# Patient Record
Sex: Male | Born: 1995 | Race: White | Hispanic: No | Marital: Single | Smoking: Never smoker
Health system: Southern US, Community
[De-identification: ages and names within clinical notes are randomized; demographics above are authoritative.]

## PROBLEM LIST (undated history)

## (undated) DIAGNOSIS — E039 Hypothyroidism, unspecified: Secondary | ICD-10-CM

## (undated) DIAGNOSIS — R6250 Unspecified lack of expected normal physiological development in childhood: Secondary | ICD-10-CM

## (undated) DIAGNOSIS — E109 Type 1 diabetes mellitus without complications: Secondary | ICD-10-CM

## (undated) DIAGNOSIS — R000 Tachycardia, unspecified: Secondary | ICD-10-CM

## (undated) DIAGNOSIS — E049 Nontoxic goiter, unspecified: Secondary | ICD-10-CM

## (undated) DIAGNOSIS — H539 Unspecified visual disturbance: Secondary | ICD-10-CM

## (undated) DIAGNOSIS — E063 Autoimmune thyroiditis: Secondary | ICD-10-CM

## (undated) DIAGNOSIS — E11618 Type 2 diabetes mellitus with other diabetic arthropathy: Secondary | ICD-10-CM

## (undated) DIAGNOSIS — B2790 Infectious mononucleosis, unspecified without complication: Secondary | ICD-10-CM

## (undated) DIAGNOSIS — E11649 Type 2 diabetes mellitus with hypoglycemia without coma: Secondary | ICD-10-CM

## (undated) DIAGNOSIS — F319 Bipolar disorder, unspecified: Secondary | ICD-10-CM

## (undated) DIAGNOSIS — I10 Essential (primary) hypertension: Secondary | ICD-10-CM

## (undated) DIAGNOSIS — F909 Attention-deficit hyperactivity disorder, unspecified type: Secondary | ICD-10-CM

## (undated) HISTORY — DX: Tachycardia, unspecified: R00.0

## (undated) HISTORY — DX: Autoimmune thyroiditis: E06.3

## (undated) HISTORY — DX: Type 1 diabetes mellitus without complications: E10.9

## (undated) HISTORY — DX: Type 2 diabetes mellitus with other diabetic arthropathy: E11.618

## (undated) HISTORY — DX: Unspecified lack of expected normal physiological development in childhood: R62.50

## (undated) HISTORY — PX: TONSILLECTOMY: SUR1361

## (undated) HISTORY — DX: Type 2 diabetes mellitus with hypoglycemia without coma: E11.649

## (undated) HISTORY — DX: Nontoxic goiter, unspecified: E04.9

---
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
## 2013-04-19 ENCOUNTER — Encounter (HOSPITAL_COMMUNITY): Payer: Self-pay | Admitting: Emergency Medicine

## 2013-04-19 ENCOUNTER — Emergency Department (HOSPITAL_COMMUNITY)
Admission: EM | Admit: 2013-04-19 | Discharge: 2013-04-20 | Disposition: A | Discharge status: Home / Self Care | Payer: Medicaid Other | Attending: Emergency Medicine | Admitting: Emergency Medicine

## 2013-04-19 DIAGNOSIS — I10 Essential (primary) hypertension: Secondary | ICD-10-CM | POA: Insufficient documentation

## 2013-04-19 DIAGNOSIS — Z8619 Personal history of other infectious and parasitic diseases: Secondary | ICD-10-CM | POA: Insufficient documentation

## 2013-04-19 DIAGNOSIS — E1049 Type 1 diabetes mellitus with other diabetic neurological complication: Secondary | ICD-10-CM | POA: Insufficient documentation

## 2013-04-19 DIAGNOSIS — B349 Viral infection, unspecified: Secondary | ICD-10-CM

## 2013-04-19 DIAGNOSIS — Z79899 Other long term (current) drug therapy: Secondary | ICD-10-CM | POA: Insufficient documentation

## 2013-04-19 DIAGNOSIS — G909 Disorder of the autonomic nervous system, unspecified: Secondary | ICD-10-CM | POA: Insufficient documentation

## 2013-04-19 DIAGNOSIS — B9789 Other viral agents as the cause of diseases classified elsewhere: Secondary | ICD-10-CM | POA: Insufficient documentation

## 2013-04-19 DIAGNOSIS — R000 Tachycardia, unspecified: Secondary | ICD-10-CM | POA: Insufficient documentation

## 2013-04-19 DIAGNOSIS — R509 Fever, unspecified: Secondary | ICD-10-CM | POA: Insufficient documentation

## 2013-04-19 DIAGNOSIS — R1110 Vomiting, unspecified: Secondary | ICD-10-CM | POA: Insufficient documentation

## 2013-04-19 DIAGNOSIS — E039 Hypothyroidism, unspecified: Secondary | ICD-10-CM | POA: Insufficient documentation

## 2013-04-19 DIAGNOSIS — Z8669 Personal history of other diseases of the nervous system and sense organs: Secondary | ICD-10-CM | POA: Insufficient documentation

## 2013-04-19 DIAGNOSIS — E063 Autoimmune thyroiditis: Secondary | ICD-10-CM | POA: Insufficient documentation

## 2013-04-19 DIAGNOSIS — Z794 Long term (current) use of insulin: Secondary | ICD-10-CM | POA: Insufficient documentation

## 2013-04-19 LAB — GLUCOSE, CAPILLARY: Glucose-Capillary: 180 mg/dL — ABNORMAL HIGH (ref 70–99)

## 2013-04-19 MED ORDER — ACETAMINOPHEN 325 MG PO TABS
650.0000 mg | ORAL_TABLET | Freq: Once | ORAL | Status: AC
  Administered 2013-04-19: 650 mg via ORAL
  Filled 2013-04-19: qty 2

## 2013-04-19 NOTE — ED Notes (Signed)
Pt. reports nasal congestion , coryza , generalized fatigue for 2 days with chills .

## 2013-04-19 NOTE — ED Notes (Signed)
cbg checked

## 2013-04-19 NOTE — ED Provider Notes (Signed)
CSN: [id number]     Arrival date & time 04/19/13  2218 History   First MD Initiated Contact with Patient 04/19/13 2235     Chief Complaint  Patient presents with  . Nasal Congestion   (Consider location/radiation/quality/duration/timing/severity/associated sxs/prior Treatment) HPI Comments: Patient presents emergency department with chief complaint of nasal congestion x2 days. He states that he has had associated subjective fever and chills. States that today he has had one episode of vomiting. He has not tried taking anything to alleviate his symptoms. His past medical history remarkable for type 1 diabetes. He is on insulin shots. He states that her sugars have been harder to control over the past couple of days. He endorses exposure to a sick coworker, who had similar symptoms. Denies sore throat. Denies productive cough.  The history is provided by the patient. No language interpreter was used.    Past Medical History  Diagnosis Date  . Hypertension   . Hypothyroidism   . Type 1 diabetes mellitus not at goal   . Goiter   . Physical growth delay   . Thyroiditis, autoimmune   . Hypertension   . Autonomic neuropathy due to diabetes   . Tachycardia   . Diabetic arthropathy   . Diabetes mellitus     Dec 2007  . Hypoglycemia associated with diabetes   . Vision abnormalities glasses  . Mononucleosis    Past Surgical History  Procedure Laterality Date  . Tonsillectomy  at young age   Family History  Problem Relation Age of Onset  . Stroke Mother   . COPD Father   . Diabetes Maternal Grandmother    History  Substance Use Topics  . Smoking status: Never Smoker   . Smokeless tobacco: Never Used  . Alcohol Use: No    Review of Systems  All other systems reviewed and are negative.    Allergies  Review of patient's allergies indicates no known allergies.  Home Medications   Current Outpatient Rx  Name  Route  Sig  Dispense  Refill  . amphetamine-dextroamphetamine  (ADDERALL XR) 15 MG 24 hr capsule   Oral   Take 15 mg by mouth every morning.         Marland Kitchen glucose blood (ACCU-CHEK SMARTVIEW) test strip      Check sugar 6 x daily   200 each   6     For use with Aviva Nano meter. For questions regar ...   . insulin aspart (NOVOLOG) 100 UNIT/ML injection   Subcutaneous   Inject 4-10 Units into the skin 3 (three) times daily before meals. Per sliding scale.         . insulin glargine (LANTUS) 100 UNIT/ML injection   Subcutaneous   Inject 33 Units into the skin at bedtime.         Marland Kitchen levothyroxine (SYNTHROID, LEVOTHROID) 25 MCG tablet   Oral   Take 1 tablet (25 mcg total) by mouth daily.   30 tablet   5   . lisinopril (PRINIVIL,ZESTRIL) 10 MG tablet   Oral   Take 10 mg by mouth daily.          BP 131/65  Pulse 113  Temp(Src) 98.1 F (36.7 C) (Oral)  Resp 16  Ht 5\' 11"  (1.803 m)  Wt 175 lb (79.379 kg)  BMI 24.42 kg/m2  SpO2 98% Physical Exam  Nursing note and vitals reviewed. Constitutional: He is oriented to person, place, and time. He appears well-developed and well-nourished.  HENT:  Head:  Normocephalic and atraumatic.  Right Ear: External ear normal.  Left Ear: External ear normal.  Nose: Nose normal.  Mouth/Throat: Oropharynx is clear and moist. No oropharyngeal exudate.  Eyes: Conjunctivae and EOM are normal. Pupils are equal, round, and reactive to light. Right eye exhibits no discharge. Left eye exhibits no discharge. No scleral icterus.  Neck: Normal range of motion. Neck supple. No JVD present.  Cardiovascular: Regular rhythm, normal heart sounds and intact distal pulses.  Exam reveals no gallop and no friction rub.   No murmur heard. Tachycardic  Pulmonary/Chest: Effort normal and breath sounds normal. No respiratory distress. He has no wheezes. He has no rales. He exhibits no tenderness.  Abdominal: Soft. Bowel sounds are normal. He exhibits no distension and no mass. There is no tenderness. There is no rebound and  no guarding.  Musculoskeletal: Normal range of motion. He exhibits no edema and no tenderness.  Neurological: He is alert and oriented to person, place, and time. He has normal reflexes.  CN 3-12 intact  Skin: Skin is warm and dry.  Psychiatric: He has a normal mood and affect. His behavior is normal. Judgment and thought content normal.    ED Course  Procedures (including critical care time) Labs Review Labs Reviewed - No data to display Imaging Review No results found.  MDM   1. Viral illness    Patients symptoms are consistent with URI, likely viral etiology. Discussed that antibiotics are not indicated for viral infections. Pt will be discharged with symptomatic treatment.  Verbalizes understanding and is agreeable with plan. Pt is hemodynamically stable & in NAD prior to dc.  Urged close monitoring of sugar.  Filed Vitals:   04/20/13 0006  BP: 112/60  Pulse: 96  Temp: 97 F (36.1 C)  Resp: [address], PA-C 04/20/13 0013

## 2013-04-20 MED ORDER — GUAIFENESIN 100 MG/5ML PO LIQD: 100.0000 mg | ORAL | Status: DC | PRN

## 2013-04-20 MED ORDER — CETIRIZINE HCL 10 MG PO TABS: 10.0000 mg | ORAL_TABLET | Freq: Every day | ORAL | Status: DC

## 2013-04-20 NOTE — ED Provider Notes (Signed)
Medical screening examination/treatment/procedure(s) were performed by non-physician practitioner and as supervising physician I was immediately available for consultation/collaboration.  Layla Maw Ward, DO 04/20/13 [phone]

## 2013-04-24 ENCOUNTER — Other Ambulatory Visit: Payer: Self-pay | Admitting: "Endocrinology

## 2013-04-24 DIAGNOSIS — E1065 Type 1 diabetes mellitus with hyperglycemia: Secondary | ICD-10-CM

## 2013-04-24 DIAGNOSIS — E038 Other specified hypothyroidism: Secondary | ICD-10-CM

## 2013-05-04 ENCOUNTER — Ambulatory Visit: Payer: Medicaid Other | Admitting: "Endocrinology

## 2013-05-04 IMAGING — CR DG CHEST 2V
2 series · 2 of 2 positions shown · non-contrast
Comparison: Chest radiograph 06/28/2011

CLINICAL DATA: Pneumothorax.

CHEST - 2 VIEW

[w chest pa]
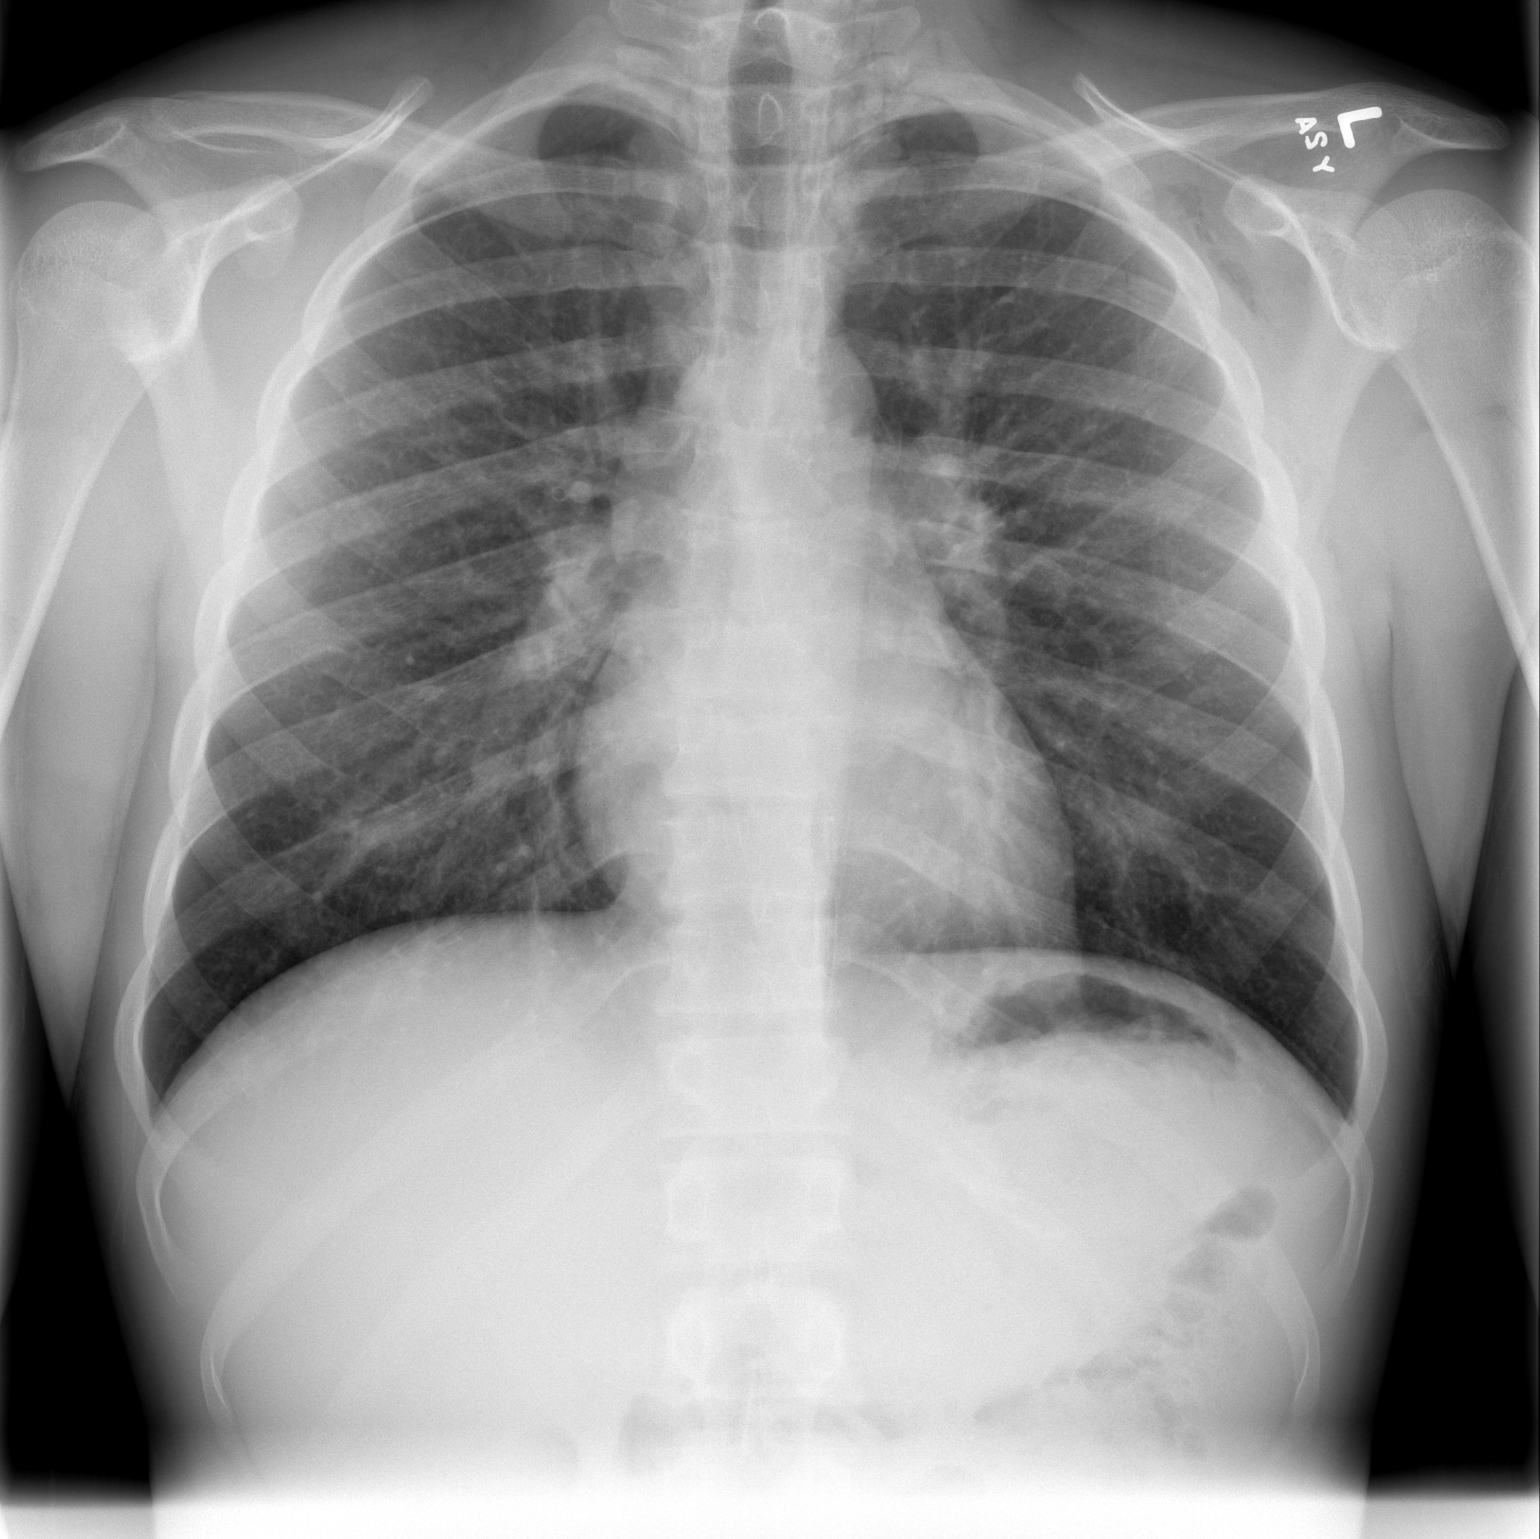

[w chest lat]
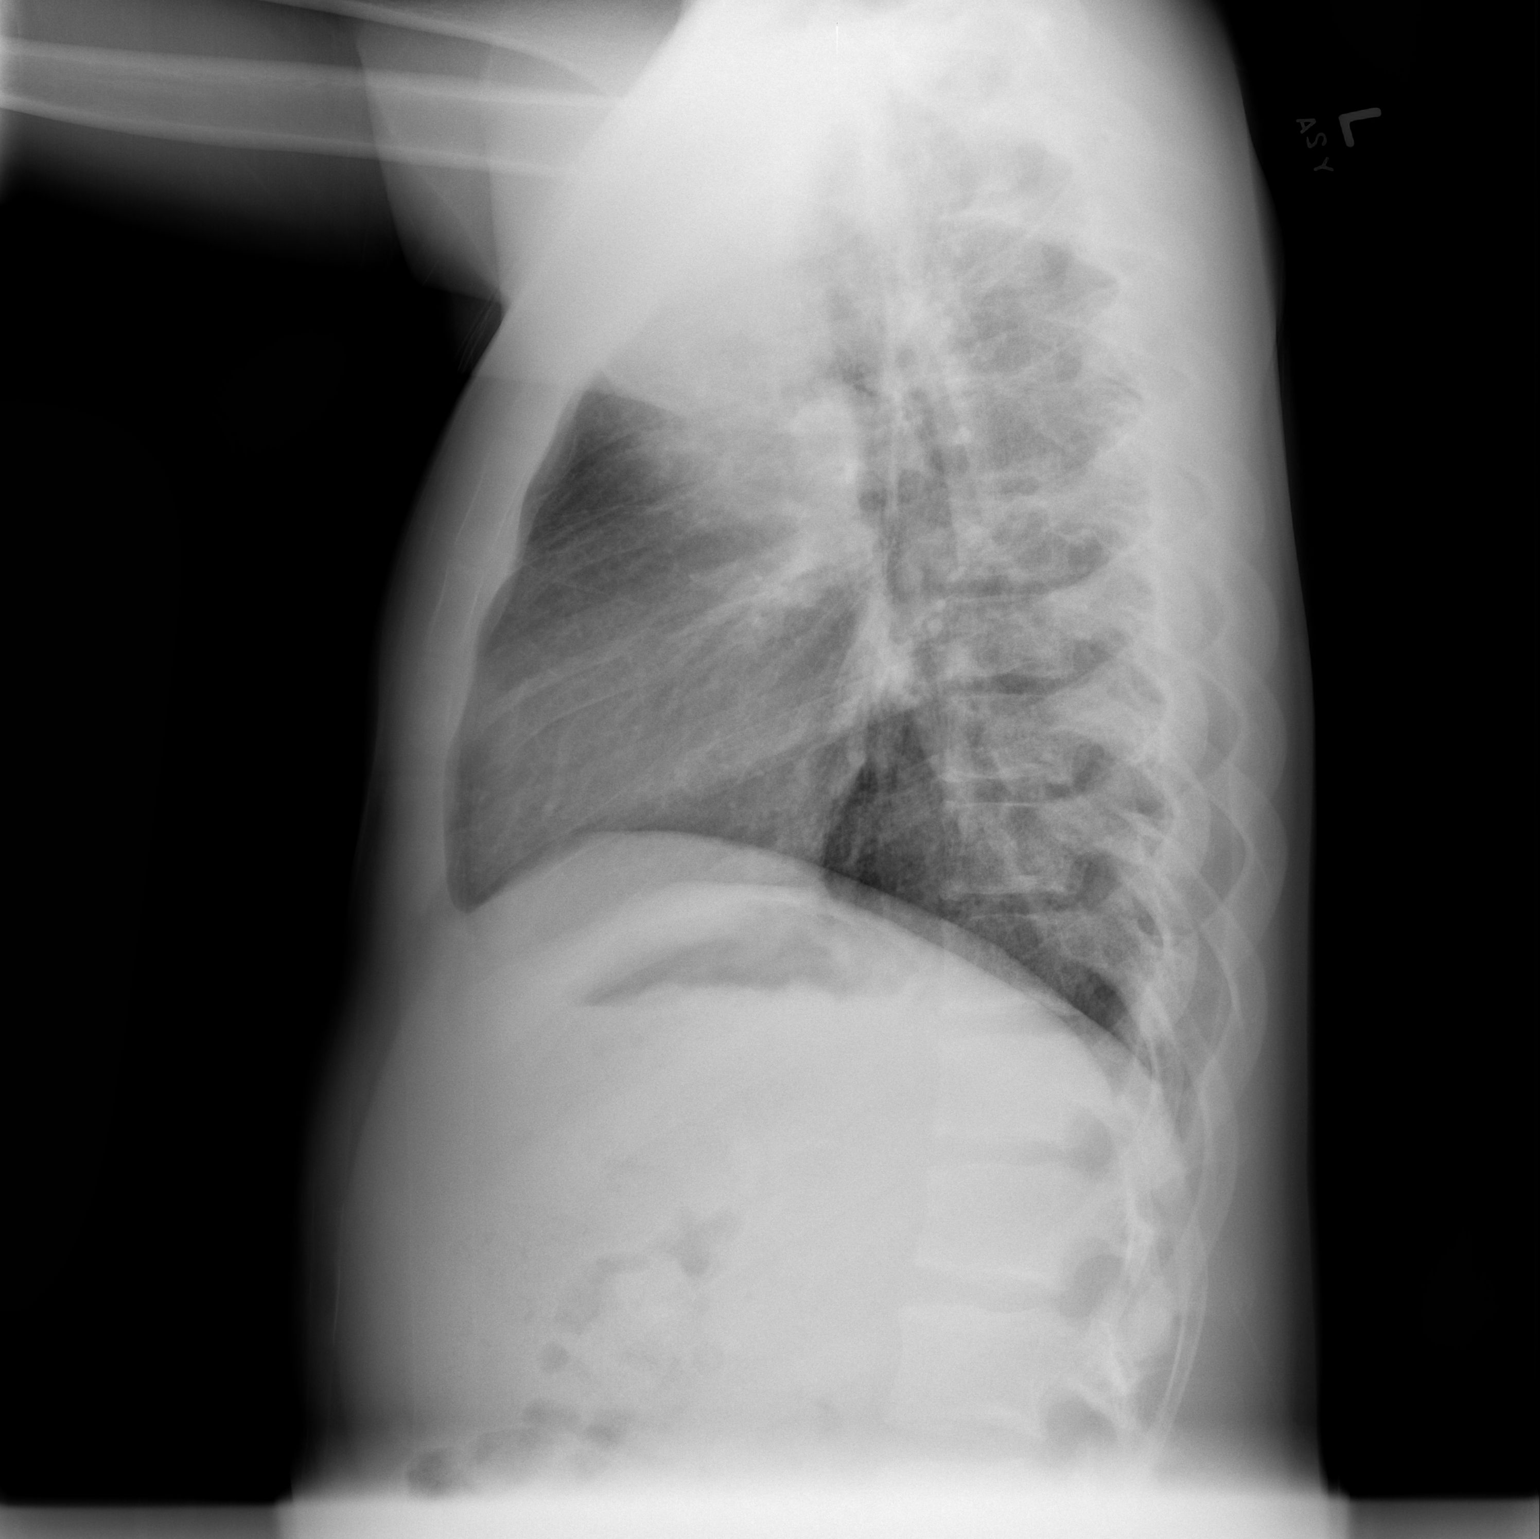

[2 of 2 positions shown; findings below may reference images not displayed]

FINDINGS: Normal heart, mediastinal, and hilar contours.  Normal
pulmonary vascularity.  The trachea is midline.  The left
pneumothorax is smaller on today's examination, as compared to
06/28/2011.  A very tiny amount of left pleural air at the apex is
visualized.  Subcutaneous emphysema in the infraclavicular region,
left axilla, and left neck is similar to slightly decreased
compared to recent prior.  No airspace disease or pleural effusion.
No acute bony abnormality.
IMPRESSION: 1.  Tiny left apical pneumothorax. Pneumothorax is decreased in
size compared to 06/28/2011.

2.  Similar to slight decrease in subcutaneous gas left chest and
neck.

## 2013-06-05 ENCOUNTER — Other Ambulatory Visit: Payer: Self-pay | Admitting: "Endocrinology

## 2013-06-05 DIAGNOSIS — E1065 Type 1 diabetes mellitus with hyperglycemia: Secondary | ICD-10-CM

## 2013-06-22 ENCOUNTER — Other Ambulatory Visit: Payer: Self-pay | Admitting: *Deleted

## 2013-06-22 DIAGNOSIS — E1065 Type 1 diabetes mellitus with hyperglycemia: Secondary | ICD-10-CM

## 2013-07-11 ENCOUNTER — Encounter: Payer: Self-pay | Admitting: "Endocrinology

## 2013-07-11 ENCOUNTER — Ambulatory Visit (INDEPENDENT_AMBULATORY_CARE_PROVIDER_SITE_OTHER): Payer: Medicaid Other | Admitting: "Endocrinology

## 2013-07-11 VITALS — BP 135/86 | HR 83 | Wt 168.0 lb

## 2013-07-11 DIAGNOSIS — E063 Autoimmune thyroiditis: Secondary | ICD-10-CM

## 2013-07-11 DIAGNOSIS — E1149 Type 2 diabetes mellitus with other diabetic neurological complication: Secondary | ICD-10-CM

## 2013-07-11 DIAGNOSIS — E1169 Type 2 diabetes mellitus with other specified complication: Secondary | ICD-10-CM

## 2013-07-11 DIAGNOSIS — E038 Other specified hypothyroidism: Secondary | ICD-10-CM

## 2013-07-11 DIAGNOSIS — E049 Nontoxic goiter, unspecified: Secondary | ICD-10-CM

## 2013-07-11 DIAGNOSIS — Z9119 Patient's noncompliance with other medical treatment and regimen: Secondary | ICD-10-CM | POA: Insufficient documentation

## 2013-07-11 DIAGNOSIS — E1143 Type 2 diabetes mellitus with diabetic autonomic (poly)neuropathy: Secondary | ICD-10-CM

## 2013-07-11 DIAGNOSIS — G909 Disorder of the autonomic nervous system, unspecified: Secondary | ICD-10-CM

## 2013-07-11 DIAGNOSIS — E1065 Type 1 diabetes mellitus with hyperglycemia: Secondary | ICD-10-CM

## 2013-07-11 DIAGNOSIS — Z91199 Patient's noncompliance with other medical treatment and regimen due to unspecified reason: Secondary | ICD-10-CM | POA: Insufficient documentation

## 2013-07-11 DIAGNOSIS — E11649 Type 2 diabetes mellitus with hypoglycemia without coma: Secondary | ICD-10-CM

## 2013-07-11 MED ORDER — GLUCOSE BLOOD VI STRP: ORAL_STRIP | Status: DC

## 2013-07-11 NOTE — Patient Instructions (Signed)
Follow up visit in 3 months. Please call Dr. Fransico Sirena Riddle on December 3rd between 8:00-9:30 PM.
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
## 2013-07-15 LAB — COMPREHENSIVE METABOLIC PANEL
Albumin: 4.2 g/dL (ref 3.5–5.2)
Alkaline Phosphatase: 116 U/L (ref 39–117)
BUN: 21 mg/dL (ref 6–23)
CO2: 32 mEq/L (ref 19–32)
Calcium: 9.7 mg/dL (ref 8.4–10.5)
Glucose, Bld: 127 mg/dL — ABNORMAL HIGH (ref 70–99)
Potassium: 4.5 mEq/L (ref 3.5–5.3)

## 2013-07-15 LAB — T3, FREE: T3, Free: 3.4 pg/mL (ref 2.3–4.2)

## 2013-07-15 LAB — MICROALBUMIN / CREATININE URINE RATIO
Creatinine, Urine: 265.3 mg/dL
Microalb Creat Ratio: 28.8 mg/g (ref 0.0–30.0)
Microalb, Ur: 7.64 mg/dL — ABNORMAL HIGH (ref 0.00–1.89)

## 2013-07-15 LAB — TSH: TSH: 4.329 u[IU]/mL (ref 0.350–4.500)

## 2013-07-15 LAB — LIPID PANEL: HDL: 66 mg/dL (ref >34)

## 2013-07-15 LAB — T4, FREE: Free T4: 1.17 ng/dL (ref 0.80–1.80)

## 2013-07-19 ENCOUNTER — Telehealth: Payer: Self-pay | Admitting: "Endocrinology

## 2013-07-19 NOTE — Telephone Encounter (Signed)
When I saw the patient in clinic on 07/11/13 I asked him to call tonight to discuss BGs. He called tonight from work, but had left his  BG meter at home. I asked him to call me tomorrow at the office when he has his BG meter in hand so we can discuss his BGs David Stall

## 2013-08-09 ENCOUNTER — Telehealth: Payer: Self-pay | Admitting: Pediatric Endocrinology

## 2013-08-09 NOTE — Telephone Encounter (Signed)
Late documentation for call 12/9  Lantus =33 units Novolog 150/30/10  12/7 82 - 209 -  158 12/8 225 244  498 12/9 68 [phone]  Novolog +1 at breakfast. Call Sunday  Zanyla Klebba REBECCA

## 2013-08-14 ENCOUNTER — Other Ambulatory Visit: Payer: Self-pay | Admitting: *Deleted

## 2013-08-14 DIAGNOSIS — E038 Other specified hypothyroidism: Secondary | ICD-10-CM

## 2013-08-14 MED ORDER — LEVOTHYROXINE SODIUM 75 MCG PO TABS: 75.0000 ug | ORAL_TABLET | Freq: Every day | ORAL | Status: DC

## 2013-08-24 ENCOUNTER — Other Ambulatory Visit: Payer: Self-pay | Admitting: "Endocrinology

## 2013-10-11 ENCOUNTER — Ambulatory Visit: Payer: Medicaid Other | Admitting: "Endocrinology

## 2013-10-17 ENCOUNTER — Emergency Department (HOSPITAL_COMMUNITY)
Admission: EM | Admit: 2013-10-17 | Discharge: 2013-10-18 | Disposition: A | Discharge status: Home / Self Care | Payer: Medicaid Other | Attending: Emergency Medicine | Admitting: Emergency Medicine

## 2013-10-17 ENCOUNTER — Encounter (HOSPITAL_COMMUNITY): Payer: Self-pay | Admitting: Emergency Medicine

## 2013-10-17 ENCOUNTER — Emergency Department (HOSPITAL_COMMUNITY): Payer: Medicaid Other

## 2013-10-17 DIAGNOSIS — R000 Tachycardia, unspecified: Secondary | ICD-10-CM | POA: Insufficient documentation

## 2013-10-17 DIAGNOSIS — R6250 Unspecified lack of expected normal physiological development in childhood: Secondary | ICD-10-CM | POA: Insufficient documentation

## 2013-10-17 DIAGNOSIS — J029 Acute pharyngitis, unspecified: Secondary | ICD-10-CM | POA: Insufficient documentation

## 2013-10-17 DIAGNOSIS — E1049 Type 1 diabetes mellitus with other diabetic neurological complication: Secondary | ICD-10-CM | POA: Insufficient documentation

## 2013-10-17 DIAGNOSIS — I10 Essential (primary) hypertension: Secondary | ICD-10-CM | POA: Insufficient documentation

## 2013-10-17 DIAGNOSIS — R05 Cough: Secondary | ICD-10-CM | POA: Insufficient documentation

## 2013-10-17 DIAGNOSIS — H539 Unspecified visual disturbance: Secondary | ICD-10-CM | POA: Insufficient documentation

## 2013-10-17 DIAGNOSIS — E1069 Type 1 diabetes mellitus with other specified complication: Secondary | ICD-10-CM | POA: Insufficient documentation

## 2013-10-17 DIAGNOSIS — X58XXXA Exposure to other specified factors, initial encounter: Secondary | ICD-10-CM | POA: Insufficient documentation

## 2013-10-17 DIAGNOSIS — E049 Nontoxic goiter, unspecified: Secondary | ICD-10-CM | POA: Insufficient documentation

## 2013-10-17 DIAGNOSIS — E063 Autoimmune thyroiditis: Secondary | ICD-10-CM | POA: Insufficient documentation

## 2013-10-17 DIAGNOSIS — G909 Disorder of the autonomic nervous system, unspecified: Secondary | ICD-10-CM | POA: Insufficient documentation

## 2013-10-17 DIAGNOSIS — K0889 Other specified disorders of teeth and supporting structures: Secondary | ICD-10-CM

## 2013-10-17 DIAGNOSIS — Y939 Activity, unspecified: Secondary | ICD-10-CM | POA: Insufficient documentation

## 2013-10-17 DIAGNOSIS — S025XXA Fracture of tooth (traumatic), initial encounter for closed fracture: Secondary | ICD-10-CM | POA: Insufficient documentation

## 2013-10-17 DIAGNOSIS — R059 Cough, unspecified: Secondary | ICD-10-CM | POA: Insufficient documentation

## 2013-10-17 DIAGNOSIS — Y929 Unspecified place or not applicable: Secondary | ICD-10-CM | POA: Insufficient documentation

## 2013-10-17 DIAGNOSIS — K089 Disorder of teeth and supporting structures, unspecified: Secondary | ICD-10-CM | POA: Insufficient documentation

## 2013-10-17 DIAGNOSIS — Z794 Long term (current) use of insulin: Secondary | ICD-10-CM | POA: Insufficient documentation

## 2013-10-17 DIAGNOSIS — B2790 Infectious mononucleosis, unspecified without complication: Secondary | ICD-10-CM | POA: Insufficient documentation

## 2013-10-17 DIAGNOSIS — Z79899 Other long term (current) drug therapy: Secondary | ICD-10-CM | POA: Insufficient documentation

## 2013-10-17 LAB — CBG MONITORING, ED: GLUCOSE-CAPILLARY: 381 mg/dL — AB (ref 70–99)

## 2013-10-17 MED ORDER — IBUPROFEN 400 MG PO TABS
800.0000 mg | ORAL_TABLET | Freq: Once | ORAL | Status: AC
  Administered 2013-10-17: 800 mg via ORAL
  Filled 2013-10-17: qty 2

## 2013-10-17 MED ORDER — SODIUM CHLORIDE 0.9 % IV BOLUS (SEPSIS)
1000.0000 mL | Freq: Once | INTRAVENOUS | Status: AC
  Administered 2013-10-17: 1000 mL via INTRAVENOUS

## 2013-10-17 NOTE — ED Provider Notes (Signed)
CSN: [id number]     Arrival date & time 10/17/13  2130 History  This chart was scribed for non-physician practitioner Lowella DellGray Aryaa Bunting, PA-C working with Layla MawKristen N Ward, DO by Danella Maiersaroline Early, ED Scribe. This patient was seen in room TR05C/TR05C and the patient's care was started at 10:02 PM.    Chief Complaint  Patient presents with  . Dental Pain   The history is provided by the patient. No language interpreter was used.   HPI Comments: [name] Noecker is a 18 y.o. male with a h/o DM1 who presents to the Emergency Department complaining of constant, worsening, throbbing, left lower dental pain onset 2 days ago when a tooth that first cracked 1 year ago cracked further and fell out. He rates the severity of the pain as a 9/10. He took 400mg  ibuprofen this morning with some relief. He reports nausea today but no vomiting. He denies fevers or chills, headache, SOB, leg swelling. He denies recent trauma or injury. He denies recent change in medications. He denies sick contacts but states he lives on campus at [employer]. He states his sugars have been running high the last few weeks since he moved to [place] but are improving. He does not smoke.    Past Medical History  Diagnosis Date  . Hypertension   . Hypothyroidism   . Type 1 diabetes mellitus not at goal   . Goiter   . Physical growth delay   . Thyroiditis, autoimmune   . Hypertension   . Autonomic neuropathy due to diabetes   . Tachycardia   . Diabetic arthropathy   . Diabetes mellitus     Dec 2007  . Hypoglycemia associated with diabetes   . Vision abnormalities glasses  . Mononucleosis    Past Surgical History  Procedure Laterality Date  . Tonsillectomy  at young age   Family History  Problem Relation Age of Onset  . Stroke Mother   . COPD Father   . Diabetes Maternal Grandmother    History  Substance Use Topics  . Smoking status: Never Smoker   . Smokeless tobacco: Never Used  . Alcohol Use: No    Review of Systems  HENT:  Positive for sore throat. Negative for trouble swallowing.   Respiratory: Positive for cough (productive with clear phlegm since this morning).   All other systems reviewed and are negative.      Allergies  Review of patient's allergies indicates no known allergies.  Home Medications   Current Outpatient Rx  Name  Route  Sig  Dispense  Refill  . amphetamine-dextroamphetamine (ADDERALL XR) 15 MG 24 hr capsule   Oral   Take 15 mg by mouth every morning.         . insulin aspart (NOVOLOG) 100 UNIT/ML injection   Subcutaneous   Inject 4-10 Units into the skin 3 (three) times daily before meals. Per sliding scale.         Marland Kitchen. LANTUS SOLOSTAR 100 UNIT/ML SOPN      INJECT 33 UNITS AT BEDTIME AS DIRECTED   5 pen   4     NEEDS REFILLS   . levothyroxine (SYNTHROID, LEVOTHROID) 75 MCG tablet   Oral   Take 1 tablet (75 mcg total) by mouth daily. Take 1/2 tablet daily for a total of 37.5 mcg daily.   15 tablet   6   . lisinopril (PRINIVIL,ZESTRIL) 10 MG tablet   Oral   Take 10 mg by mouth daily.         .Marland Kitchen  NOVOLOG FLEXPEN 100 UNIT/ML SOPN FlexPen      INJECT UP TO 50 UNITS DAILY AS DIRECTED   5 pen   4     NEEDS REFILLS   . ibuprofen (ADVIL,MOTRIN) 800 MG tablet   Oral   Take 1 tablet (800 mg total) by mouth 3 (three) times daily.   21 tablet   0   . penicillin v potassium (VEETID) 500 MG tablet   Oral   Take 1 tablet (500 mg total) by mouth 4 (four) times daily.   28 tablet   0    BP 133/94  Pulse 118  Temp(Src) 99.2 F (37.3 C) (Oral)  Resp 18  Ht 5\' 11"  (1.803 m)  Wt 180 lb (81.647 kg)  BMI 25.12 kg/m2  SpO2 97% Physical Exam  Nursing note and vitals reviewed. Constitutional: He is oriented to person, place, and time. He appears well-developed and well-nourished. No distress.  HENT:  Head: Normocephalic and atraumatic.  Mouth/Throat:    Pulp exposed.  Eyes: Conjunctivae are normal.  Neck: No tracheal deviation present.  Cardiovascular:  Tachycardia present.   Pulmonary/Chest: Effort normal. No respiratory distress.  Musculoskeletal: Normal range of motion. He exhibits no edema.  Neurological: He is alert and oriented to person, place, and time.  Skin: Skin is warm and dry. He is not diaphoretic.  Psychiatric: He has a normal mood and affect. His behavior is normal.    ED Course  Procedures (including critical care time) Medications  ibuprofen (ADVIL,MOTRIN) tablet 800 mg (800 mg Oral Given 10/17/13 2322)  sodium chloride 0.9 % bolus 1,000 mL (0 mLs Intravenous Stopped 10/18/13 0050)    DIAGNOSTIC STUDIES: Oxygen Saturation is 97% on RA, normal by my interpretation.    COORDINATION OF CARE: 10:46 PM- Discussed treatment plan with pt which includes CXR, antibiotics, pain meds, and dentist referral. Pt agrees to plan.    Labs Review Labs Reviewed  CBG MONITORING, ED - Abnormal; Notable for the following:    Glucose-Capillary 381 (*)    All other components within normal limits  CBG MONITORING, ED - Abnormal; Notable for the following:    Glucose-Capillary 334 (*)    All other components within normal limits   Imaging Review No results found.   EKG Interpretation None      MDM   Final diagnoses:  Pain, dental  Broken tooth   Patient tachycardic on presentation. Improved with IV fluids.  Glucose 381, improved to 334 with IV fluids. Patient states he has not yet taken his nightly insulin. Patient asymptomatic, doubt DKA.   CXR shows no evidence of Pneumonia and is negative for PTX. Minimal peribronchial thickening noted.  Plan to treat patient's pain and start on antibiotics with close Dental follow up within 24 hours. Patient agrees with plan. Discharged in good condition.   Meds given in ED:  Medications  ibuprofen (ADVIL,MOTRIN) tablet 800 mg (800 mg Oral Given 10/17/13 2322)  sodium chloride 0.9 % bolus 1,000 mL (0 mLs Intravenous Stopped 10/18/13 0050)    Discharge Medication List as of 10/18/2013   1:12 AM    START taking these medications   Details  ibuprofen (ADVIL,MOTRIN) 800 MG tablet Take 1 tablet (800 mg total) by mouth 3 (three) times daily., Starting 10/18/2013, Until Discontinued, Print    penicillin v potassium (VEETID) 500 MG tablet Take 1 tablet (500 mg total) by mouth 4 (four) times daily., Starting 10/18/2013, Last dose on Wed 10/25/13, Print  I personally performed the services described in this documentation, which was scribed in my presence. The recorded information has been reviewed and is accurate.   Rudene Anda, PA-C 10/20/13 1301

## 2013-10-17 NOTE — ED Notes (Signed)
Bottom left side of mouth has a broken tooth.

## 2013-10-17 NOTE — ED Notes (Signed)
CBG 381 

## 2013-10-18 ENCOUNTER — Emergency Department (HOSPITAL_COMMUNITY): Payer: Medicaid Other

## 2013-10-18 LAB — CBG MONITORING, ED: GLUCOSE-CAPILLARY: 334 mg/dL — AB (ref 70–99)

## 2013-10-18 MED ORDER — PENICILLIN V POTASSIUM 500 MG PO TABS: 500.0000 mg | ORAL_TABLET | Freq: Four times a day (QID) | ORAL | Status: AC

## 2013-10-18 MED ORDER — IBUPROFEN 800 MG PO TABS: 800.0000 mg | ORAL_TABLET | Freq: Three times a day (TID) | ORAL | Status: DC

## 2013-10-18 NOTE — Discharge Instructions (Signed)

## 2013-10-20 NOTE — ED Provider Notes (Signed)
Medical screening examination/treatment/procedure(s) were performed by non-physician practitioner and as supervising physician I was immediately available for consultation/collaboration.   EKG Interpretation None        Kenzey Birkland N Varnell Orvis, DO 10/20/13 1503 

## 2013-11-05 ENCOUNTER — Emergency Department (HOSPITAL_COMMUNITY)
Admission: EM | Admit: 2013-11-05 | Discharge: 2013-11-05 | Disposition: A | Discharge status: Home / Self Care | Payer: Medicaid Other | Attending: Emergency Medicine | Admitting: Emergency Medicine

## 2013-11-05 ENCOUNTER — Encounter (HOSPITAL_COMMUNITY): Payer: Self-pay | Admitting: Emergency Medicine

## 2013-11-05 DIAGNOSIS — G909 Disorder of the autonomic nervous system, unspecified: Secondary | ICD-10-CM | POA: Insufficient documentation

## 2013-11-05 DIAGNOSIS — R739 Hyperglycemia, unspecified: Secondary | ICD-10-CM

## 2013-11-05 DIAGNOSIS — E1049 Type 1 diabetes mellitus with other diabetic neurological complication: Secondary | ICD-10-CM | POA: Insufficient documentation

## 2013-11-05 DIAGNOSIS — Z794 Long term (current) use of insulin: Secondary | ICD-10-CM | POA: Insufficient documentation

## 2013-11-05 DIAGNOSIS — I10 Essential (primary) hypertension: Secondary | ICD-10-CM

## 2013-11-05 DIAGNOSIS — Z8619 Personal history of other infectious and parasitic diseases: Secondary | ICD-10-CM | POA: Insufficient documentation

## 2013-11-05 DIAGNOSIS — K029 Dental caries, unspecified: Secondary | ICD-10-CM | POA: Insufficient documentation

## 2013-11-05 DIAGNOSIS — R05 Cough: Secondary | ICD-10-CM | POA: Insufficient documentation

## 2013-11-05 DIAGNOSIS — E039 Hypothyroidism, unspecified: Secondary | ICD-10-CM | POA: Insufficient documentation

## 2013-11-05 DIAGNOSIS — R059 Cough, unspecified: Secondary | ICD-10-CM

## 2013-11-05 DIAGNOSIS — Z79899 Other long term (current) drug therapy: Secondary | ICD-10-CM | POA: Insufficient documentation

## 2013-11-05 DIAGNOSIS — K047 Periapical abscess without sinus: Secondary | ICD-10-CM

## 2013-11-05 DIAGNOSIS — E063 Autoimmune thyroiditis: Secondary | ICD-10-CM | POA: Insufficient documentation

## 2013-11-05 LAB — CBG MONITORING, ED
GLUCOSE-CAPILLARY: 453 mg/dL — AB (ref 70–99)
GLUCOSE-CAPILLARY: 280 mg/dL — AB (ref 70–99)

## 2013-11-05 LAB — URINALYSIS, ROUTINE W REFLEX MICROSCOPIC
Bilirubin Urine: NEGATIVE
Hgb urine dipstick: NEGATIVE
Ketones, ur: 15 mg/dL — AB
LEUKOCYTES UA: NEGATIVE
NITRITE: NEGATIVE
PROTEIN: NEGATIVE mg/dL
Specific Gravity, Urine: 1.037 — ABNORMAL HIGH (ref 1.005–1.030)
Urobilinogen, UA: 0.2 mg/dL (ref 0.0–1.0)
pH: 6 (ref 5.0–8.0)

## 2013-11-05 LAB — BASIC METABOLIC PANEL
BUN: 14 mg/dL (ref 6–23)
CALCIUM: 10 mg/dL (ref 8.4–10.5)
CO2: 25 meq/L (ref 19–32)
CREATININE: 0.67 mg/dL (ref 0.50–1.35)
Chloride: 90 mEq/L — ABNORMAL LOW (ref 96–112)
GFR calc Af Amer: >90 mL/min (ref >90)
GFR calc non Af Amer: >90 mL/min (ref >90)
Glucose, Bld: 498 mg/dL — ABNORMAL HIGH (ref 70–99)
Potassium: 4.3 mEq/L (ref 3.7–5.3)
SODIUM: 132 meq/L — AB (ref 137–147)

## 2013-11-05 LAB — CBC WITH DIFFERENTIAL/PLATELET
BASOS ABS: 0 10*3/uL (ref 0.0–0.1)
BASOS PCT: 0 % (ref 0–1)
Eosinophils Absolute: 0.1 10*3/uL (ref 0.0–0.7)
Eosinophils Relative: 1 % (ref 0–5)
HEMATOCRIT: 44 % (ref 39.0–52.0)
Hemoglobin: 16.2 g/dL (ref 13.0–17.0)
LYMPHS PCT: 15 % (ref 12–46)
Lymphs Abs: 1.4 10*3/uL (ref 0.7–4.0)
MCH: 30.1 pg (ref 26.0–34.0)
MCHC: 36.8 g/dL — ABNORMAL HIGH (ref 30.0–36.0)
MCV: 81.6 fL (ref 78.0–100.0)
Monocytes Absolute: 0.8 10*3/uL (ref 0.1–1.0)
Monocytes Relative: 8 % (ref 3–12)
NEUTROS ABS: 7.4 10*3/uL (ref 1.7–7.7)
NEUTROS PCT: 76 % (ref 43–77)
PLATELETS: 266 10*3/uL (ref 150–400)
RBC: 5.39 MIL/uL (ref 4.22–5.81)
RDW: 12.3 % (ref 11.5–15.5)
WBC: 9.6 10*3/uL (ref 4.0–10.5)

## 2013-11-05 LAB — URINE MICROSCOPIC-ADD ON

## 2013-11-05 MED ORDER — ALBUTEROL SULFATE HFA 108 (90 BASE) MCG/ACT IN AERS
2.0000 | INHALATION_SPRAY | Freq: Once | RESPIRATORY_TRACT | Status: AC
  Administered 2013-11-05: 2 via RESPIRATORY_TRACT
  Filled 2013-11-05: qty 6.7

## 2013-11-05 MED ORDER — CLINDAMYCIN HCL 150 MG PO CAPS: 300.0000 mg | ORAL_CAPSULE | Freq: Three times a day (TID) | ORAL | Status: DC

## 2013-11-05 MED ORDER — HYDROCODONE-ACETAMINOPHEN 5-325 MG PO TABS: 1.0000 | ORAL_TABLET | ORAL | Status: DC | PRN

## 2013-11-05 MED ORDER — IBUPROFEN 400 MG PO TABS
800.0000 mg | ORAL_TABLET | Freq: Once | ORAL | Status: AC
  Administered 2013-11-05: 800 mg via ORAL
  Filled 2013-11-05: qty 2

## 2013-11-05 MED ORDER — SODIUM CHLORIDE 0.9 % IV BOLUS (SEPSIS)
1000.0000 mL | Freq: Once | INTRAVENOUS | Status: AC
  Administered 2013-11-05: 1000 mL via INTRAVENOUS

## 2013-11-05 NOTE — ED Notes (Signed)
Pt c/o right upper toothache onset today. Pt reports seen here couple week ago for left sided toothache. Pt woke up today with swelling to right upper tooth area. Pt was eating something hard and it caused drainage from right upper tooth to occur.

## 2013-11-05 NOTE — ED Notes (Signed)
Approx 700 ml infused.  Pt alert.

## 2013-11-05 NOTE — Discharge Instructions (Signed)
Continue to take Ibuprofen for pain Take Vicodin for severe pain - Please be careful with this medication.  It can cause drowsiness.  Use caution while driving, operating machinery, drinking alcohol, or any other activities that may impair your physical or mental abilities.   Take 2 puffs every 4-6 hours as needed for cough - follow-up with your doctor for continued management of your cough  Continue to take your lisinopril for your blood pressure - follow-up with your doctor regarding this  Continue to take your lantus and novolog for hyperglycemia - follow-up with your doctor next week for continued care  Follow-up at your dentist appointment this week  Return to the emergency department if you develop any changing/worsening condition, repeated vomiting, fever, difficulty swallowing/breathing, increased facial swelling, or any other concerns (please read additional information regarding your condition below)     Dental Abscess A dental abscess is a collection of infected fluid (pus) from a bacterial infection in the inner part of the tooth (pulp). It usually occurs at the end of the tooth's root.  CAUSES   Severe tooth decay.  Trauma to the tooth that allows bacteria to enter into the pulp, such as a broken or chipped tooth. SYMPTOMS   Severe pain in and around the infected tooth.  Swelling and redness around the abscessed tooth or in the mouth or face.  Tenderness.  Pus drainage.  Bad breath.  Bitter taste in the mouth.  Difficulty swallowing.  Difficulty opening the mouth.  Nausea.  Vomiting.  Chills.  Swollen neck glands. DIAGNOSIS   A medical and dental history will be taken.  An examination will be performed by tapping on the abscessed tooth.  X-rays may be taken of the tooth to identify the abscess. TREATMENT The goal of treatment is to eliminate the infection. You may be prescribed antibiotic medicine to stop the infection from spreading. A root canal may  be performed to save the tooth. If the tooth cannot be saved, it may be pulled (extracted) and the abscess may be drained.  HOME CARE INSTRUCTIONS  Only take over-the-counter or prescription medicines for pain, fever, or discomfort as directed by your caregiver.  Rinse your mouth (gargle) often with salt water ( tsp salt in 8 oz [250 ml] of warm water) to relieve pain or swelling.  Do not drive after taking pain medicine (narcotics).  Do not apply heat to the outside of your face.  Return to your dentist for further treatment as directed. SEEK MEDICAL CARE IF:  Your pain is not helped by medicine.  Your pain is getting worse instead of better. SEEK IMMEDIATE MEDICAL CARE IF:  You have a fever or persistent symptoms for more than 2 3 days.  You have a fever and your symptoms suddenly get worse.  You have chills or a very bad headache.  You have problems breathing or swallowing.  You have trouble opening your mouth.  You have swelling in the neck or around the eye. Document Released: 08/03/2005 Document Revised: 04/27/2012 Document Reviewed: 11/11/2010 [facility] Patient Information 2014 [place], [place].  Hyperglycemia Hyperglycemia occurs when the glucose (sugar) in your blood is too high. Hyperglycemia can happen for many reasons, but it most often happens to people who do not know they have diabetes or are not managing their diabetes properly.  CAUSES  Whether you have diabetes or not, there are other causes of hyperglycemia. Hyperglycemia can occur when you have diabetes, but it can also occur in other situations that you  might not be as aware of, such as: Diabetes  If you have diabetes and are having problems controlling your blood glucose, hyperglycemia could occur because of some of the following reasons:  Not following your meal plan.  Not taking your diabetes medications or not taking it properly.  Exercising less or doing less activity than you normally  do.  Being sick. Pre-diabetes  This cannot be ignored. Before people develop Type 2 diabetes, they almost always have "pre-diabetes." This is when your blood glucose levels are higher than normal, but not yet high enough to be diagnosed as diabetes. Research has shown that some long-term damage to the body, especially the heart and circulatory system, may already be occurring during pre-diabetes. If you take action to manage your blood glucose when you have pre-diabetes, you may delay or prevent Type 2 diabetes from developing. Stress  If you have diabetes, you may be "diet" controlled or on oral medications or insulin to control your diabetes. However, you may find that your blood glucose is higher than usual in the hospital whether you have diabetes or not. This is often referred to as "stress hyperglycemia." Stress can elevate your blood glucose. This happens because of hormones put out by the body during times of stress. If stress has been the cause of your high blood glucose, it can be followed regularly by your caregiver. That way he/she can make sure your hyperglycemia does not continue to get worse or progress to diabetes. Steroids  Steroids are medications that act on the infection fighting system (immune system) to block inflammation or infection. One side effect can be a rise in blood glucose. Most people can produce enough extra insulin to allow for this rise, but for those who cannot, steroids make blood glucose levels go even higher. It is not unusual for steroid treatments to "uncover" diabetes that is developing. It is not always possible to determine if the hyperglycemia will go away after the steroids are stopped. A special blood test called an A1c is sometimes done to determine if your blood glucose was elevated before the steroids were started. SYMPTOMS  Thirsty.  Frequent urination.  Dry mouth.  Blurred vision.  Tired or fatigue.  Weakness.  Sleepy.  Tingling in feet  or leg. DIAGNOSIS  Diagnosis is made by monitoring blood glucose in one or all of the following ways:  A1c test. This is a chemical found in your blood.  Fingerstick blood glucose monitoring.  Laboratory results. TREATMENT  First, knowing the cause of the hyperglycemia is important before the hyperglycemia can be treated. Treatment may include, but is not be limited to:  Education.  Change or adjustment in medications.  Change or adjustment in meal plan.  Treatment for an illness, infection, etc.  More frequent blood glucose monitoring.  Change in exercise plan.  Decreasing or stopping steroids.  Lifestyle changes. HOME CARE INSTRUCTIONS   Test your blood glucose as directed.  Exercise regularly. Your caregiver will give you instructions about exercise. Pre-diabetes or diabetes which comes on with stress is helped by exercising.  Eat wholesome, balanced meals. Eat often and at regular, fixed times. Your caregiver or nutritionist will give you a meal plan to guide your sugar intake.  Being at an ideal weight is important. If needed, losing as little as 10 to 15 pounds may help improve blood glucose levels. SEEK MEDICAL CARE IF:   You have questions about medicine, activity, or diet.  You continue to have symptoms (problems such as  increased thirst, urination, or weight gain). SEEK IMMEDIATE MEDICAL CARE IF:   You are vomiting or have diarrhea.  Your breath smells fruity.  You are breathing faster or slower.  You are very sleepy or incoherent.  You have numbness, tingling, or pain in your feet or hands.  You have chest pain.  Your symptoms get worse even though you have been following your caregiver's orders.  If you have any other questions or concerns. Document Released: 01/27/2001 Document Revised: 10/26/2011 Document Reviewed: 11/30/2011 [facility] Patient Information 2014 [place], [place].  Hypertension As your heart beats, it forces blood through your  arteries. This force is your blood pressure. If the pressure is too high, it is called hypertension (HTN) or high blood pressure. HTN is dangerous because you may have it and not know it. High blood pressure may mean that your heart has to work harder to pump blood. Your arteries may be narrow or stiff. The extra work puts you at risk for heart disease, stroke, and other problems.  Blood pressure consists of two numbers, a higher number over a lower, 110/72, for example. It is stated as "110 over 72." The ideal is below 120 for the top number (systolic) and under 80 for the bottom (diastolic). Write down your blood pressure today. You should pay close attention to your blood pressure if you have certain conditions such as:  Heart failure.  Prior heart attack.  Diabetes  Chronic kidney disease.  Prior stroke.  Multiple risk factors for heart disease. To see if you have HTN, your blood pressure should be measured while you are seated with your arm held at the level of the heart. It should be measured at least twice. A one-time elevated blood pressure reading (especially in the Emergency Department) does not mean that you need treatment. There may be conditions in which the blood pressure is different between your right and left arms. It is important to see your caregiver soon for a recheck. Most people have essential hypertension which means that there is not a specific cause. This type of high blood pressure may be lowered by changing lifestyle factors such as:  Stress.  Smoking.  Lack of exercise.  Excessive weight.  Drug/tobacco/alcohol use.  Eating less salt. Most people do not have symptoms from high blood pressure until it has caused damage to the body. Effective treatment can often prevent, delay or reduce that damage. TREATMENT  When a cause has been identified, treatment for high blood pressure is directed at the cause. There are a large number of medications to treat HTN. These  fall into several categories, and your caregiver will help you select the medicines that are best for you. Medications may have side effects. You should review side effects with your caregiver. If your blood pressure stays high after you have made lifestyle changes or started on medicines,   Your medication(s) may need to be changed.  Other problems may need to be addressed.  Be certain you understand your prescriptions, and know how and when to take your medicine.  Be sure to follow up with your caregiver within the time frame advised (usually within two weeks) to have your blood pressure rechecked and to review your medications.  If you are taking more than one medicine to lower your blood pressure, make sure you know how and at what times they should be taken. Taking two medicines at the same time can result in blood pressure that is too low. SEEK IMMEDIATE MEDICAL CARE  IF:  You develop a severe headache, blurred or changing vision, or confusion.  You have unusual weakness or numbness, or a faint feeling.  You have severe chest or abdominal pain, vomiting, or breathing problems. MAKE SURE YOU:   Understand these instructions.  Will watch your condition.  Will get help right away if you are not doing well or get worse. Document Released: 08/03/2005 Document Revised: 10/26/2011 Document Reviewed: 03/23/2008 [facility] Patient Information 2014 [place], [place].  Cough, Adult  A cough is a reflex that helps clear your throat and airways. It can help heal the body or may be a reaction to an irritated airway. A cough may only last 2 or 3 weeks (acute) or may last more than 8 weeks (chronic).  CAUSES Acute cough:  Viral or bacterial infections. Chronic cough:  Infections.  Allergies.  Asthma.  Post-nasal drip.  Smoking.  Heartburn or acid reflux.  Some medicines.  Chronic lung problems (COPD).  Cancer. SYMPTOMS   Cough.  Fever.  Chest pain.  Increased breathing  rate.  High-pitched whistling sound when breathing (wheezing).  Colored mucus that you cough up (sputum). TREATMENT   A bacterial cough may be treated with antibiotic medicine.  A viral cough must run its course and will not respond to antibiotics.  Your caregiver may recommend other treatments if you have a chronic cough. HOME CARE INSTRUCTIONS   Only take over-the-counter or prescription medicines for pain, discomfort, or fever as directed by your caregiver. Use cough suppressants only as directed by your caregiver.  Use a cold steam vaporizer or humidifier in your bedroom or home to help loosen secretions.  Sleep in a semi-upright position if your cough is worse at night.  Rest as needed.  Stop smoking if you smoke. SEEK IMMEDIATE MEDICAL CARE IF:   You have pus in your sputum.  Your cough starts to worsen.  You cannot control your cough with suppressants and are losing sleep.  You begin coughing up blood.  You have difficulty breathing.  You develop pain which is getting worse or is uncontrolled with medicine.  You have a fever. MAKE SURE YOU:   Understand these instructions.  Will watch your condition.  Will get help right away if you are not doing well or get worse. Document Released: 01/30/2011 Document Revised: 10/26/2011 Document Reviewed: 01/30/2011 [facility] Patient Information 2014 [place], [place].   Emergency Department Resource Guide 1) Find a Doctor and Pay Out of Pocket Although you won't have to find out who is covered by your insurance plan, it is a good idea to ask around and get recommendations. You will then need to call the office and see if the doctor you have chosen will accept you as a new patient and what types of options they offer for patients who are self-pay. Some doctors offer discounts or will set up payment plans for their patients who do not have insurance, but you will need to ask so you aren't surprised when you get to your  appointment.  2) Contact Your Local Health Department Not all health departments have doctors that can see patients for sick visits, but many do, so it is worth a call to see if yours does. If you don't know where your local health department is, you can check in your phone book. The CDC also has a tool to help you locate your state's health department, and many state websites also have listings of all of their local health departments.  3) Find a Walk-in  Clinic If your illness is not likely to be very severe or complicated, you may want to try a walk in clinic. These are popping up all over the country in pharmacies, drugstores, and shopping centers. They're usually staffed by nurse practitioners or physician assistants that have been trained to treat common illnesses and complaints. They're usually fairly quick and inexpensive. However, if you have serious medical issues or chronic medical problems, these are probably not your best option.  No Primary Care Doctor: - Call Health Connect at  [phone] - they can help you locate a primary care doctor that  accepts your insurance, provides certain services, etc. - Physician Referral Service- [phone]  Chronic Pain Problems: Organization         Address  Phone   Notes  [facility]  [phone] Patients need to be referred by their primary care doctor.   Medication Assistance: Organization         Address  Phone   Notes  [facility] Medication [facility] [address] [place] [place], [place] [address] [phone] --Must be a resident of [facility] -- Must have NO insurance coverage whatsoever (no Medicaid/ Medicare, etc.) -- The pt. MUST have a primary care doctor that directs their care regularly and follows them in the community   [facility]  [phone]   [employer]  [phone]    Agencies that provide inexpensive medical care: Organization         Address  Phone   Notes  [facility]  [phone]   [facility]    [phone]   [facility] [address] [place], [place] [address] [phone]   [facility] [address] [place] [phone]   [facility]anned Parenthood    [phone]   [facility]    [phone]   [facility]  [address], [place] Phone:  [phone], Fax:  [phone] Hours of Operation:  9 am - 6 pm, M-F.  Also accepts Medicaid/Medicare and self-pay.  [facility]  [address], [place] Phone: [phone], Fax: [phone]. Hours of Operation:  8:30 am - 5:30 pm, M-F.  Also accepts Medicaid and self-pay.  [facility] [place] [address] [place] Phone: [phone]   [facility] [address] Natasha Bence [place], [place] [phone], Ext. [phone] Mondays & Thursdays: 7-9 AM.  First 15 patients are seen on a first come, first serve basis.    Medicaid-accepting [facility] Providers:  Organization         Address  Phone   Notes  [facility] [address], [place] [phone] Also accepts self-pay patients.  [facility] [address] Laurell Josephs [place], [place]  [phone]   [facility] [address], [place] [phone]   [facility] [address] [place] [phone]   Renaye Rakers [address], [place]   [phone] Only accepts [place] Access [place] patients after they have their name applied to their card.   Self-Pay (no insurance) in [facility]:  Organization         Address  Phone   Notes  Sickle Cell Patients, [facility] [address] [place], [place] [phone])  [phone]   [facility] [address] [place], [place] [phone]   [facility]  [place]  [address], [place] [phone]   [facility]/Dr. Osei-Bonsu  [address], [place] or [address], [place] [phone] Phone number for both [place] and [place] locations is the same.  [facility] [address] [place] [phone]   [facility] [address] [place] or [address] [phone]   [facility] [address], [place] [phone], phone; [phone], fax Sees patients 1st and 3rd Saturday of every month.  Must not qualify for public or private insurance (i.e. Medicaid, Medicare, [place] Health Choice, Veterans' Benefits)  Household income should be no more than 200% of the poverty level The clinic cannot treat you if you are pregnant or think you are pregnant  Sexually transmitted diseases are not treated at the clinic.    Dental Care: Organization         Address  Phone  Notes  [facility] Department of [facility] [address] [place], [place] [phone] Accepts children up to age 62 who are enrolled in [place] or [place] Health Choice; pregnant women with a Medicaid card; and children who have applied for Medicaid or [place] Health Choice, but were declined, whose parents can pay a reduced fee at time of service.  [facility] of [facility]  [address], [place] [phone] Accepts children up to age 50 who are enrolled in [place] or [place] Health Choice; pregnant women with a Medicaid card; and children who have applied for Medicaid or [place] Health Choice, but were declined, whose parents can pay a reduced fee at time of service.  [facility]  [address] [place], [place] [phone] Patients are seen by appointment only. Walk-ins are not accepted. [facility] will see patients 32 years of age and  older. Monday - Tuesday (8am-5pm) Most Wednesdays (8:30-5pm) $30 per visit, cash only  [facility]  [address], [facility] [phone] Patients are seen by appointment only. Walk-ins are not accepted. [facility] will see patients 60 years of age and older. One Wednesday Evening (Monthly: Volunteer Based).  $30 per visit, cash only  [employer]  [phone] for adults; Children under age 74, call Graduate Pediatric Dentistry at [phone]. Children aged 22-14, please call [phone] to request a pediatric application.  Dental services are provided in all areas of dental care including fillings, crowns and bridges, complete and partial dentures, implants, gum treatment, root canals, and extractions. Preventive care is also provided. Treatment is provided to both adults and children. Patients are selected via a lottery and there is often a waiting list.   [facility] [address] [place]  [phone] [email or web address]   [facility] [address] [place], [place] [phone], Ext. [phone] Second and Fourth Thursday of each month, opens at 6:30 AM; Clinic ends at 9 AM.  Patients are seen on a first-come first-served basis, and a limited number are seen during each clinic.   [facility]  [address] Ether Griffins [place], [place] [phone]   Eligibility Requirements You must have lived in [place], [place], or [place] counties for  at least the last three months.   You cannot be eligible for state or federal sponsored [place], including [employer], [place], or [employer].   You generally cannot be eligible for healthcare insurance through your employer.    How to apply: Eligibility screenings are held every Tuesday and Wednesday afternoon from 1:00 pm until 4:00 pm. You do not need an appointment for the interview!  [facility] [address]  [place], [place] [phone]   [facility] Health Department  [phone]   [facility] Health Department  [phone]   [facility] Health Department  [phone]    Behavioral Health Resources in the Community: Intensive Outpatient Programs Organization         Address  Phone  Notes  [facility] [address], [place], [place] [phone]   [facility] [address] [place], [place] [phone]   [facility] [address], [place], [place]  [phone]   [facility] [address]  [place], [place] [phone] or [phone]   Substance Abuse Resources Organization         Address  Phone  Notes  [facility]  [phone]   [facility]  [phone]   [facility] [place]  [phone]   Floydene Flock  [phone]   Residential & Outpatient Substance Abuse Program  [phone]   Psychological Services Organization         Address  Phone  Notes  [facility] Behavioral Health  [phone]   [facility]  [phone]   [facility] [address] [place] [phone] or [phone]    Mobile Crisis Teams Organization         Address  Phone  Notes  [facility], Mobile Crisis Care Unit  [phone]   [facility]  [address] [place], [place] [phone]   Doristine Locks [address] [place] [phone]    Self-Help/Support Groups Organization         Address  Phone             Notes  [facility] [place] - variety of support groups  336- [phone] Call for more information  Narcotics Anonymous (NA), [facility] [address], [employer] [place]  2 meetings at this location   Statistician         Address  Phone  Notes  [facility] [address],    [place]  [phone]   [facility]  [address] [place] [address] [place] [address], [place], [place] [phone]   [facility] [address] [place], [place] [place] [phone] Admissions: 8am-3pm M-F  [facility] [address],    [place], [place] [phone]   [facility] [address] [place], [place] [phone]   [facility] [address],  [place], [place] [phone]   [facility] Intensive Outpatient [address], [place], [place] [phone]   [facility] ([facility] [address] [place]  [place], [place] [phone] or [phone]   [facility] [address], [place], [place] [phone] Accepts Medicaid  [facility] [address]  [place] [phone] Substance Abuse/Addiction Treatment   [facility] Resources Organization         Address  Phone  Notes  [facility]  [phone]  [phone]   °Julie Brannon, PhD [address] [place], [place]   [phone] or [phone]   °[facility]   [address] °[place], [place] [phone]   °[facility] [address], [place], [place] [phone] Insurance/Medicaid/sponsorship through [facility]  °[facility] [address]                                    [place], [place] [phone] Therapy/tele-psych/case  °[facility] [address]  ° [place], [place] [phone]    °Dr. Arfeen  [phone]   °[facility] [place]  [employer] [facility] 1) [address], [place] °2) [address], [place] °3)  [address], [place] [phone] °[phone] ° °[phone]   °[place] [facility] [phone] or [phone] (After Hours)    ° ° ° °

## 2013-11-05 NOTE — ED Provider Notes (Signed)
CSN: [id number]     Arrival date & time 11/05/13  1647 History  This chart was scribed for non-physician practitioner, Coral CeoJessica Titiana Severa, PA-C, working with Flint MelterElliott L [place], MD by Smiley HousemanFallon Davis, ED Scribe. This patient was seen in room TR05C/TR05C and the patient's care was started at 5:43 PM.    Chief Complaint  Patient presents with  . Dental Pain    Right upper   The history is provided by the patient. No language interpreter was used.   HPI Comments: [name] is a 18 y.o. male who presents to the Emergency Department complaining of constant moderate right upper dental pain that started earlier today. Pt states this morning he woke with swelling to the right upper tooth area.  He states he went to lunch with friends where he was eating something hard. Patient states he bit down and the area began to drain blood and pus. Pt denies fever and chills. Patient seen on 10/17/13 for left lower dental pain, which improved after Penicillin and Ibuprofen. Patient completed antibiotic. Patient has a follow-up appointment with Dr. Barbette MerinoJensen on Wednesday. He reports he has taken ibuprofen with some relief. Pt states he was seen for a cough at the last visit. He reports the cough has been present for 8 months. Denies any worsening or acute changes of his condition. Denies any SOB, chest pain, dyspnea, hemoptysis. Pt denies any recent travels and surgeries, hx of DVT/PE. No tobacco use. Patient has been taking scheduled medications with no missed doses.   Past Medical History  Diagnosis Date  . Hypertension   . Hypothyroidism   . Type 1 diabetes mellitus not at goal   . Goiter   . Physical growth delay   . Thyroiditis, autoimmune   . Hypertension   . Autonomic neuropathy due to diabetes   . Tachycardia   . Diabetic arthropathy   . Diabetes mellitus     Dec 2007  . Hypoglycemia associated with diabetes   . Vision abnormalities glasses  . Mononucleosis    Past Surgical History  Procedure Laterality  Date  . Tonsillectomy  at young age   Family History  Problem Relation Age of Onset  . Stroke Mother   . COPD Father   . Diabetes Maternal Grandmother    History  Substance Use Topics  . Smoking status: Never Smoker   . Smokeless tobacco: Never Used  . Alcohol Use: No    Review of Systems  Constitutional: Negative for fever, chills, activity change and appetite change.  HENT: Positive for dental problem and facial swelling. Negative for drooling, sore throat and trouble swallowing.   Respiratory: Positive for cough. Negative for chest tightness, shortness of breath and wheezing.   Cardiovascular: Negative for chest pain.  Gastrointestinal: Negative for nausea, vomiting, abdominal pain and diarrhea.  Neurological: Negative for headaches.  Psychiatric/Behavioral: Negative for behavioral problems and confusion.  All other systems reviewed and are negative.   Allergies  Review of patient's allergies indicates no known allergies.  Home Medications   Current Outpatient Rx  Name  Route  Sig  Dispense  Refill  . amphetamine-dextroamphetamine (ADDERALL XR) 15 MG 24 hr capsule   Oral   Take 15 mg by mouth every morning.         . insulin aspart (NOVOLOG) 100 UNIT/ML injection   Subcutaneous   Inject 4-10 Units into the skin 3 (three) times daily before meals. Per sliding scale.         Marland Kitchen. LANTUS  SOLOSTAR 100 UNIT/ML SOPN      INJECT 33 UNITS AT BEDTIME AS DIRECTED   5 pen   4     NEEDS REFILLS   . levothyroxine (SYNTHROID, LEVOTHROID) 75 MCG tablet   Oral   Take 75 mcg by mouth every other day.         . lisinopril (PRINIVIL,ZESTRIL) 10 MG tablet   Oral   Take 10 mg by mouth daily.          Triage Vitals: BP 151/71  Pulse 108  Temp(Src) 99.3 F (37.4 C) (Oral)  Resp 16  Ht 6' (1.829 m)  Wt 185 lb (83.915 kg)  BMI 25.08 kg/m2  SpO2 98%  Filed Vitals:   11/05/13 1753 11/05/13 1819 11/05/13 1952 11/05/13 2111  BP: 147/69  148/89 133/72  Pulse: 104  97  97  Temp: 99 F (37.2 C)  98.5 F (36.9 C) 98.9 F (37.2 C)  TempSrc: Oral  Oral Oral  Resp: 18  18 20   Height:      Weight:      SpO2: 98% 98% 100% 100%    Physical Exam  Nursing note and vitals reviewed. Constitutional: He is oriented to person, place, and time. He appears well-developed and well-nourished. No distress.  HENT:  Head: Normocephalic and atraumatic.    Right Ear: External ear normal.  Left Ear: External ear normal.  Nose: Nose normal.  Mouth/Throat: Oropharynx is clear and moist.    Dental cary to the right upper pre-molar with dental abscess to the right upper cheek. No evidence of drainage or open wounds. Palpable 1 cm x 1 cm circular mass in the right lower cheek. Dental cary in the left lower pre-molar with no surrounding edema. No trismus. No difficulty controlling secretions. Tympanic membranes gray and translucent bilaterally with no erythema, edema, or hemotympanum.  No mastoid or tragal tenderness bilaterally.  Eyes: Conjunctivae and EOM are normal. Pupils are equal, round, and reactive to light. Right eye exhibits no discharge. Left eye exhibits no discharge.  Neck: Normal range of motion. Neck supple. No tracheal deviation present.  No cervical lymphadenopathy. No nuchal rigidity. No submental fullness.   Cardiovascular: Normal rate, regular rhythm, normal heart sounds and intact distal pulses.  Exam reveals no gallop and no friction rub.   No murmur heard. Pulmonary/Chest: Effort normal and breath sounds normal. No respiratory distress. He has no wheezes. He has no rales. He exhibits no tenderness.  Abdominal: Soft. He exhibits no distension. There is no tenderness.  Musculoskeletal: Normal range of motion. He exhibits no edema and no tenderness.  No LE edema or calf tenderness bilaterally  Neurological: He is alert and oriented to person, place, and time.  Skin: Skin is warm and dry. He is not diaphoretic.  Psychiatric: He has a normal mood and  affect. His behavior is normal.    ED Course  Dental Date/Time: 11/05/2013 6:49 PM Performed by: Coral Ceo K Authorized by: Jillyn Ledger Consent: Verbal consent obtained. Consent given by: patient Patient identity confirmed: verbally with patient Local anesthesia used: yes Local anesthetic: lidocaine 2% without epinephrine Anesthetic total: 1 ml Patient sedated: no Patient tolerance: Patient tolerated the procedure well with no immediate complications. Comments: I&D with an 11 blade scalpal to a right upper dental abscess with minimal bleeding and purulent drainage.     (including critical care time) DIAGNOSTIC STUDIES: Oxygen Saturation is 98% on RA, normal by my interpretation.    COORDINATION OF CARE: 5:53 PM-  Will drain abscess.  Will discharge pt with an inhaler. Patient informed of current plan of treatment and evaluation and agrees with plan.     Results for orders placed during the hospital encounter of 11/05/13  BASIC METABOLIC PANEL      Result Value Ref Range   Sodium 132 (*) 137 - 147 mEq/L   Potassium 4.3  3.7 - 5.3 mEq/L   Chloride 90 (*) 96 - 112 mEq/L   CO2 25  19 - 32 mEq/L   Glucose, Bld 498 (*) 70 - 99 mg/dL   BUN 14  6 - 23 mg/dL   Creatinine, Ser 2.13  0.50 - 1.35 mg/dL   Calcium 08.6  8.4 - 57.8 mg/dL   GFR calc non Af Amer >90  >90 mL/min   GFR calc Af Amer >90  >90 mL/min  URINALYSIS, ROUTINE W REFLEX MICROSCOPIC      Result Value Ref Range   Color, Urine YELLOW  YELLOW   APPearance CLEAR  CLEAR   Specific Gravity, Urine 1.037 (*) 1.005 - 1.030   pH 6.0  5.0 - 8.0   Glucose, UA >1000 (*) NEGATIVE mg/dL   Hgb urine dipstick NEGATIVE  NEGATIVE   Bilirubin Urine NEGATIVE  NEGATIVE   Ketones, ur 15 (*) NEGATIVE mg/dL   Protein, ur NEGATIVE  NEGATIVE mg/dL   Urobilinogen, UA 0.2  0.0 - 1.0 mg/dL   Nitrite NEGATIVE  NEGATIVE   Leukocytes, UA NEGATIVE  NEGATIVE  CBC WITH DIFFERENTIAL      Result Value Ref Range   WBC 9.6  4.0 - 10.5  K/uL   RBC 5.39  4.22 - 5.81 MIL/uL   Hemoglobin 16.2  13.0 - 17.0 g/dL   HCT 46.9  62.9 - 52.8 %   MCV 81.6  78.0 - 100.0 fL   MCH 30.1  26.0 - 34.0 pg   MCHC 36.8 (*) 30.0 - 36.0 g/dL   RDW 41.3  24.4 - 01.0 %   Platelets 266  150 - 400 K/uL   Neutrophils Relative % 76  43 - 77 %   Neutro Abs 7.4  1.7 - 7.7 K/uL   Lymphocytes Relative 15  12 - 46 %   Lymphs Abs 1.4  0.7 - 4.0 K/uL   Monocytes Relative 8  3 - 12 %   Monocytes Absolute 0.8  0.1 - 1.0 K/uL   Eosinophils Relative 1  0 - 5 %   Eosinophils Absolute 0.1  0.0 - 0.7 K/uL   Basophils Relative 0  0 - 1 %   Basophils Absolute 0.0  0.0 - 0.1 K/uL  URINE MICROSCOPIC-ADD ON      Result Value Ref Range   Squamous Epithelial / LPF RARE  RARE  CBG MONITORING, ED      Result Value Ref Range   Glucose-Capillary 453 (*) 70 - 99 mg/dL  CBG MONITORING, ED      Result Value Ref Range   Glucose-Capillary 280 (*) 70 - 99 mg/dL     MDM   TYION BOYLEN is a 18 y.o. male who presents to the Emergency Department complaining of constant moderate right upper dental pain that started earlier today.  Rechecks  7:30 PM = Patient had improvements in his cough with albuterol inhaler. 1L done. Ordering another 1L and will re-check BS.  8:40 PM = Patient non-toxic. States ready for discharge.     Etiology of dental pain possibly due to dental caries. Evidence of a dental abscess  which was drained in the ED. No concerning signs/symptoms for Ludwig's Angina at this time. Patient afebrile and non-toxic in appearance. Patient has follow-up with his dentist on Wednesday. Will discharge on clindamycin. Patient found to have elevated BS at 498 which reduced to 280 in the ED after 2L IV fluids. Patient also had mild tachycardia (108) which improved with IV fluids. Patient currently on Lantus and Novolog. Patient found to have elevated AG of 17. DKA not likely. Patient instructed to continue to take insulin at home and follow-up with PCP. Blood  pressure mildly elevated at 140/80's. Instructed patient to continue anti-hypertensive medications. Patient incidentally states cough has continued since last ED visit with no acute worsening or changes. Recent previous chest x-ray negative, however, showed bronchial thickening. Doubt pneumonia. Patient given albuterol inhaler in the ED with some improvements. No hypoxia, respiratory distress, or tachypnea. Lungs clear to auscultation. Instructed patient to follow-up with PCP regarding this. Return precautions, discharge instructions, and follow-up was discussed with the patient before discharge.     Discharge Medication List as of 11/05/2013  9:12 PM    START taking these medications   Details  clindamycin (CLEOCIN) 150 MG capsule Take 2 capsules (300 mg total) by mouth 3 (three) times daily. May dispense as 150mg  capsules, Starting 11/05/2013, Until Discontinued, Print    HYDROcodone-acetaminophen (NORCO/VICODIN) 5-325 MG per tablet Take 1 tablet by mouth every 4 (four) hours as needed., Starting 11/05/2013, Until Discontinued, Print        Final impressions: 1. Dental abscess   2. Hyperglycemia   3. Cough   4. Hypertension      Luiz Iron PA-C   This patient was discussed with Dr. Arloa Koh, PA-C 11/06/13 2100

## 2013-11-06 NOTE — ED Provider Notes (Signed)
Medical screening examination/treatment/procedure(s) were performed by non-physician practitioner and as supervising physician I was immediately available for consultation/collaboration.   EKG Interpretation None       Flint MelterElliott L Jadzia Ibsen, MD 11/06/13 2109

## 2013-12-26 ENCOUNTER — Other Ambulatory Visit: Payer: Self-pay | Admitting: "Endocrinology

## 2014-04-21 ENCOUNTER — Other Ambulatory Visit: Payer: Self-pay | Admitting: "Endocrinology

## 2014-04-25 ENCOUNTER — Other Ambulatory Visit: Payer: Self-pay | Admitting: "Endocrinology

## 2014-04-25 NOTE — Telephone Encounter (Signed)
This is his last refill, He has had 3 no shows and is no longer a patient in this office. This is  His 90 days.

## 2014-05-16 ENCOUNTER — Other Ambulatory Visit: Payer: Self-pay | Admitting: "Endocrinology

## 2014-06-12 ENCOUNTER — Emergency Department (HOSPITAL_COMMUNITY)
Admission: EM | Admit: 2014-06-12 | Discharge: 2014-06-12 | Discharge status: Against Medical Advice | Payer: Medicaid Other | Attending: Emergency Medicine | Admitting: Emergency Medicine

## 2014-06-12 ENCOUNTER — Encounter (HOSPITAL_COMMUNITY): Payer: Self-pay | Admitting: Emergency Medicine

## 2014-06-12 DIAGNOSIS — R112 Nausea with vomiting, unspecified: Secondary | ICD-10-CM | POA: Diagnosis not present

## 2014-06-12 DIAGNOSIS — I10 Essential (primary) hypertension: Secondary | ICD-10-CM | POA: Diagnosis not present

## 2014-06-12 DIAGNOSIS — E1143 Type 2 diabetes mellitus with diabetic autonomic (poly)neuropathy: Secondary | ICD-10-CM | POA: Insufficient documentation

## 2014-06-12 DIAGNOSIS — Z8619 Personal history of other infectious and parasitic diseases: Secondary | ICD-10-CM | POA: Insufficient documentation

## 2014-06-12 DIAGNOSIS — E11610 Type 2 diabetes mellitus with diabetic neuropathic arthropathy: Secondary | ICD-10-CM | POA: Insufficient documentation

## 2014-06-12 DIAGNOSIS — R109 Unspecified abdominal pain: Secondary | ICD-10-CM | POA: Insufficient documentation

## 2014-06-12 LAB — CBC WITH DIFFERENTIAL/PLATELET
BASOS ABS: 0 10*3/uL (ref 0.0–0.1)
Basophils Relative: 1 % (ref 0–1)
EOS ABS: 0.2 10*3/uL (ref 0.0–0.7)
EOS PCT: 2 % (ref 0–5)
HCT: 40.1 % (ref 39.0–52.0)
Hemoglobin: 14.8 g/dL (ref 13.0–17.0)
LYMPHS ABS: 2.6 10*3/uL (ref 0.7–4.0)
LYMPHS PCT: 38 % (ref 12–46)
MCH: 29.7 pg (ref 26.0–34.0)
MCHC: 36.9 g/dL — ABNORMAL HIGH (ref 30.0–36.0)
MCV: 80.5 fL (ref 78.0–100.0)
Monocytes Absolute: 0.5 10*3/uL (ref 0.1–1.0)
Monocytes Relative: 8 % (ref 3–12)
NEUTROS PCT: 51 % (ref 43–77)
Neutro Abs: 3.5 10*3/uL (ref 1.7–7.7)
PLATELETS: 231 10*3/uL (ref 150–400)
RBC: 4.98 MIL/uL (ref 4.22–5.81)
RDW: 11.7 % (ref 11.5–15.5)
WBC: 6.8 10*3/uL (ref 4.0–10.5)

## 2014-06-12 LAB — COMPREHENSIVE METABOLIC PANEL
ALT: 18 U/L (ref 0–53)
AST: 18 U/L (ref 0–37)
Albumin: 3.8 g/dL (ref 3.5–5.2)
Alkaline Phosphatase: 119 U/L — ABNORMAL HIGH (ref 39–117)
Anion gap: 15 (ref 5–15)
BUN: 29 mg/dL — ABNORMAL HIGH (ref 6–23)
CALCIUM: 9.6 mg/dL (ref 8.4–10.5)
CO2: 26 meq/L (ref 19–32)
Chloride: 95 mEq/L — ABNORMAL LOW (ref 96–112)
Creatinine, Ser: 0.98 mg/dL (ref 0.50–1.35)
GFR calc Af Amer: >90 mL/min (ref >90)
GFR calc non Af Amer: >90 mL/min (ref >90)
Glucose, Bld: 228 mg/dL — ABNORMAL HIGH (ref 70–99)
Potassium: 4.4 mEq/L (ref 3.7–5.3)
SODIUM: 136 meq/L — AB (ref 137–147)
TOTAL PROTEIN: 6.8 g/dL (ref 6.0–8.3)
Total Bilirubin: 0.4 mg/dL (ref 0.3–1.2)

## 2014-06-12 LAB — CBG MONITORING, ED: GLUCOSE-CAPILLARY: 254 mg/dL — AB (ref 70–99)

## 2014-06-12 LAB — LIPASE, BLOOD: Lipase: 34 U/L (ref 11–59)

## 2014-06-12 MED ORDER — ONDANSETRON 4 MG PO TBDP
8.0000 mg | ORAL_TABLET | Freq: Once | ORAL | Status: AC
  Administered 2014-06-12: 8 mg via ORAL
  Filled 2014-06-12: qty 2

## 2014-06-12 NOTE — ED Notes (Signed)
Pt turned in pager with registration and left.

## 2014-06-12 NOTE — ED Notes (Signed)
Pt c/o abdominal pain, n/v for the past two day. Pt denies diarrhea. Pt is unknown if he has been around anyone sick. Emesis x 1.

## 2014-07-05 ENCOUNTER — Other Ambulatory Visit: Payer: Self-pay | Admitting: "Endocrinology

## 2014-07-06 NOTE — Telephone Encounter (Signed)
Patient needs to schedule appointment with provider for additional refills.

## 2014-07-13 ENCOUNTER — Other Ambulatory Visit: Payer: Self-pay | Admitting: "Endocrinology

## 2014-08-24 ENCOUNTER — Other Ambulatory Visit: Payer: Self-pay | Admitting: "Endocrinology

## 2014-11-01 ENCOUNTER — Other Ambulatory Visit: Payer: Self-pay | Admitting: "Endocrinology

## 2014-12-20 ENCOUNTER — Other Ambulatory Visit: Payer: Self-pay | Admitting: "Endocrinology

## 2015-02-01 ENCOUNTER — Encounter (HOSPITAL_COMMUNITY): Payer: Self-pay | Admitting: *Deleted

## 2015-02-01 ENCOUNTER — Ambulatory Visit (HOSPITAL_COMMUNITY)
Admission: AD | Admit: 2015-02-01 | Discharge: 2015-02-01 | Disposition: A | Discharge status: Home / Self Care | Payer: Medicaid Other | Attending: Psychiatry | Admitting: Psychiatry

## 2015-02-01 ENCOUNTER — Emergency Department (HOSPITAL_COMMUNITY)
Admission: EM | Admit: 2015-02-01 | Discharge: 2015-02-04 | Disposition: A | Discharge status: Other Acute Inpatient Hospital | Payer: Medicaid Other | Attending: Emergency Medicine | Admitting: Emergency Medicine

## 2015-02-01 DIAGNOSIS — F323 Major depressive disorder, single episode, severe with psychotic features: Secondary | ICD-10-CM | POA: Insufficient documentation

## 2015-02-01 DIAGNOSIS — F329 Major depressive disorder, single episode, unspecified: Secondary | ICD-10-CM | POA: Diagnosis not present

## 2015-02-01 DIAGNOSIS — E1065 Type 1 diabetes mellitus with hyperglycemia: Secondary | ICD-10-CM | POA: Insufficient documentation

## 2015-02-01 DIAGNOSIS — F1620 Hallucinogen dependence, uncomplicated: Secondary | ICD-10-CM | POA: Diagnosis not present

## 2015-02-01 DIAGNOSIS — F4310 Post-traumatic stress disorder, unspecified: Secondary | ICD-10-CM | POA: Diagnosis not present

## 2015-02-01 DIAGNOSIS — R4689 Other symptoms and signs involving appearance and behavior: Secondary | ICD-10-CM | POA: Diagnosis present

## 2015-02-01 DIAGNOSIS — E039 Hypothyroidism, unspecified: Secondary | ICD-10-CM | POA: Insufficient documentation

## 2015-02-01 DIAGNOSIS — F909 Attention-deficit hyperactivity disorder, unspecified type: Secondary | ICD-10-CM | POA: Insufficient documentation

## 2015-02-01 DIAGNOSIS — E049 Nontoxic goiter, unspecified: Secondary | ICD-10-CM | POA: Diagnosis not present

## 2015-02-01 DIAGNOSIS — I10 Essential (primary) hypertension: Secondary | ICD-10-CM | POA: Diagnosis not present

## 2015-02-01 DIAGNOSIS — R45851 Suicidal ideations: Secondary | ICD-10-CM | POA: Diagnosis not present

## 2015-02-01 DIAGNOSIS — Z8669 Personal history of other diseases of the nervous system and sense organs: Secondary | ICD-10-CM | POA: Insufficient documentation

## 2015-02-01 DIAGNOSIS — F6089 Other specific personality disorders: Secondary | ICD-10-CM | POA: Diagnosis not present

## 2015-02-01 DIAGNOSIS — Z794 Long term (current) use of insulin: Secondary | ICD-10-CM | POA: Insufficient documentation

## 2015-02-01 DIAGNOSIS — F3130 Bipolar disorder, current episode depressed, mild or moderate severity, unspecified: Secondary | ICD-10-CM | POA: Diagnosis present

## 2015-02-01 DIAGNOSIS — Z79899 Other long term (current) drug therapy: Secondary | ICD-10-CM | POA: Insufficient documentation

## 2015-02-01 DIAGNOSIS — F314 Bipolar disorder, current episode depressed, severe, without psychotic features: Secondary | ICD-10-CM | POA: Diagnosis not present

## 2015-02-01 DIAGNOSIS — Z8619 Personal history of other infectious and parasitic diseases: Secondary | ICD-10-CM | POA: Diagnosis not present

## 2015-02-01 DIAGNOSIS — F1020 Alcohol dependence, uncomplicated: Secondary | ICD-10-CM | POA: Diagnosis not present

## 2015-02-01 DIAGNOSIS — R739 Hyperglycemia, unspecified: Secondary | ICD-10-CM

## 2015-02-01 HISTORY — DX: Attention-deficit hyperactivity disorder, unspecified type: F90.9

## 2015-02-01 LAB — CBC
HCT: 41.1 % (ref 39.0–52.0)
Hemoglobin: 14.2 g/dL (ref 13.0–17.0)
MCH: 28.9 pg (ref 26.0–34.0)
MCHC: 34.5 g/dL (ref 30.0–36.0)
MCV: 83.5 fL (ref 78.0–100.0)
PLATELETS: 211 10*3/uL (ref 150–400)
RBC: 4.92 MIL/uL (ref 4.22–5.81)
RDW: 12 % (ref 11.5–15.5)
WBC: 7.9 10*3/uL (ref 4.0–10.5)

## 2015-02-01 LAB — COMPREHENSIVE METABOLIC PANEL
ALT: 19 U/L (ref 17–63)
ANION GAP: 13 (ref 5–15)
AST: 22 U/L (ref 15–41)
Albumin: 4.1 g/dL (ref 3.5–5.0)
Alkaline Phosphatase: 111 U/L (ref 38–126)
BILIRUBIN TOTAL: 1.3 mg/dL — AB (ref 0.3–1.2)
BUN: 23 mg/dL — ABNORMAL HIGH (ref 6–20)
CALCIUM: 8.9 mg/dL (ref 8.9–10.3)
CO2: 23 mmol/L (ref 22–32)
Chloride: 94 mmol/L — ABNORMAL LOW (ref 101–111)
Creatinine, Ser: 1.26 mg/dL — ABNORMAL HIGH (ref 0.61–1.24)
Glucose, Bld: 695 mg/dL (ref 65–99)
Potassium: 4.9 mmol/L (ref 3.5–5.1)
Sodium: 130 mmol/L — ABNORMAL LOW (ref 135–145)
Total Protein: 6.7 g/dL (ref 6.5–8.1)

## 2015-02-01 LAB — URINALYSIS, ROUTINE W REFLEX MICROSCOPIC
Bilirubin Urine: NEGATIVE
Glucose, UA: >1000 mg/dL — AB
Hgb urine dipstick: NEGATIVE
Ketones, ur: 40 mg/dL — AB
Leukocytes, UA: NEGATIVE
NITRITE: NEGATIVE
Protein, ur: NEGATIVE mg/dL
SPECIFIC GRAVITY, URINE: 1.03 (ref 1.005–1.030)
UROBILINOGEN UA: 0.2 mg/dL (ref 0.0–1.0)
pH: 5 (ref 5.0–8.0)

## 2015-02-01 LAB — ACETAMINOPHEN LEVEL: Acetaminophen (Tylenol), Serum: <10 ug/mL — ABNORMAL LOW (ref 10–30)

## 2015-02-01 LAB — CBG MONITORING, ED
GLUCOSE-CAPILLARY: 342 mg/dL — AB (ref 65–99)
Glucose-Capillary: 598 mg/dL (ref 65–99)

## 2015-02-01 LAB — RAPID URINE DRUG SCREEN, HOSP PERFORMED
Amphetamines: NOT DETECTED
BENZODIAZEPINES: NOT DETECTED
Barbiturates: NOT DETECTED
COCAINE: NOT DETECTED
OPIATES: NOT DETECTED
Tetrahydrocannabinol: NOT DETECTED

## 2015-02-01 LAB — SALICYLATE LEVEL: Salicylate Lvl: <4 mg/dL (ref 2.8–30.0)

## 2015-02-01 LAB — URINE MICROSCOPIC-ADD ON: URINE-OTHER: NONE SEEN

## 2015-02-01 LAB — ETHANOL: Alcohol, Ethyl (B): <5 mg/dL (ref <5)

## 2015-02-01 MED ORDER — LISINOPRIL 10 MG PO TABS
10.0000 mg | ORAL_TABLET | Freq: Every day | ORAL | Status: DC
  Administered 2015-02-02 – 2015-02-04 (×3): 10 mg via ORAL
  Filled 2015-02-01 (×3): qty 1

## 2015-02-01 MED ORDER — SODIUM CHLORIDE 0.9 % IV BOLUS (SEPSIS)
1000.0000 mL | INTRAVENOUS | Status: AC
Start: 2015-02-01 — End: 2015-02-02
  Administered 2015-02-01: 1000 mL via INTRAVENOUS

## 2015-02-01 MED ORDER — PAROXETINE HCL 20 MG PO TABS
20.0000 mg | ORAL_TABLET | Freq: Every day | ORAL | Status: DC
  Administered 2015-02-02 (×2): 20 mg via ORAL
  Filled 2015-02-01 (×3): qty 1

## 2015-02-01 MED ORDER — INSULIN GLARGINE 100 UNIT/ML ~~LOC~~ SOLN
33.0000 [IU] | Freq: Every day | SUBCUTANEOUS | Status: DC
  Administered 2015-02-02 – 2015-02-04 (×4): 33 [IU] via SUBCUTANEOUS
  Filled 2015-02-01 (×4): qty 0.33

## 2015-02-01 MED ORDER — SODIUM CHLORIDE 0.9 % IV BOLUS (SEPSIS)
1000.0000 mL | INTRAVENOUS | Status: AC
  Administered 2015-02-01: 1000 mL via INTRAVENOUS

## 2015-02-01 MED ORDER — LEVOTHYROXINE SODIUM 75 MCG PO TABS
75.0000 ug | ORAL_TABLET | ORAL | Status: DC
  Administered 2015-02-02 – 2015-02-04 (×2): 75 ug via ORAL
  Filled 2015-02-01 (×2): qty 1

## 2015-02-01 MED ORDER — INSULIN ASPART 100 UNIT/ML ~~LOC~~ SOLN
10.0000 [IU] | Freq: Once | SUBCUTANEOUS | Status: AC
  Administered 2015-02-01: 10 [IU] via INTRAVENOUS
  Filled 2015-02-01: qty 1

## 2015-02-01 MED ORDER — INSULIN ASPART 100 UNIT/ML ~~LOC~~ SOLN: 10.0000 [IU] | Freq: Three times a day (TID) | SUBCUTANEOUS | Status: DC

## 2015-02-01 MED ORDER — AMPHETAMINE-DEXTROAMPHET ER 10 MG PO CP24
20.0000 mg | ORAL_CAPSULE | Freq: Two times a day (BID) | ORAL | Status: DC
  Administered 2015-02-02 – 2015-02-03 (×2): 20 mg via ORAL
  Filled 2015-02-01 (×2): qty 2
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
## 2015-02-01 NOTE — Progress Notes (Signed)
Patient was referred for IP treatment at: Under Review:[name], [facility], [facility], [facility], [facility], [facility], and [facility] [facility].  [facility] - per Nicholos Johns, fax for review. [facility] - per intake, fax referral. [facility] - no answer. [facility] Constant - fax referral for tomorrow. [facility] - voicemail [facility] - per Burna Mortimer, fax referral for waitlist. [facility] - per Morrie Sheldon, fax it. [facility] - voicemail. [facility] - per intake, fax it for review.  Melbourne Abts, LCSWA Disposition staff 02/01/2015 11:48 PM

## 2015-02-01 NOTE — ED Notes (Signed)
Notified EDP, Romeo Apple pt. CBG 342.

## 2015-02-01 NOTE — ED Notes (Signed)
Pt brought to the ER from [facility]; pt c/o suicidal ideations for the last 4 days; pt states that he has several plans that he can act on; pt will not tell me the specific plans; pt states that he has had problems all his life but now he needs to help himself; pt states that he has felt his blood sugar being elevated all day; pt state that he has only missed one dose of his diabetes medicine

## 2015-02-01 NOTE — ED Notes (Signed)
Pt has been seen and wanded by security.  Pt's belongings are in bags and black suitcase.

## 2015-02-01 NOTE — ED Notes (Signed)
Pt gluc 695 on CMP Dr Romeo Apple aware

## 2015-02-01 NOTE — BH Assessment (Signed)
Informed Dr. Romeo Apple of recommendation to seek inpt treatment.   Clista Bernhardt, [facility] Triage Specialist 02/01/2015 11:37 PM

## 2015-02-02 DIAGNOSIS — R45851 Suicidal ideations: Secondary | ICD-10-CM | POA: Diagnosis not present

## 2015-02-02 DIAGNOSIS — R4689 Other symptoms and signs involving appearance and behavior: Secondary | ICD-10-CM | POA: Diagnosis present

## 2015-02-02 DIAGNOSIS — F6089 Other specific personality disorders: Secondary | ICD-10-CM | POA: Diagnosis not present

## 2015-02-02 DIAGNOSIS — F329 Major depressive disorder, single episode, unspecified: Secondary | ICD-10-CM | POA: Diagnosis not present

## 2015-02-02 LAB — CBG MONITORING, ED
GLUCOSE-CAPILLARY: 157 mg/dL — AB (ref 65–99)
Glucose-Capillary: 216 mg/dL — ABNORMAL HIGH (ref 65–99)
Glucose-Capillary: 238 mg/dL — ABNORMAL HIGH (ref 65–99)

## 2015-02-02 MED ORDER — INSULIN ASPART 100 UNIT/ML ~~LOC~~ SOLN
7.5000 [IU] | Freq: Three times a day (TID) | SUBCUTANEOUS | Status: DC
  Administered 2015-02-02 – 2015-02-03 (×4): 8 [IU] via SUBCUTANEOUS
  Administered 2015-02-03: 4 [IU] via SUBCUTANEOUS
  Administered 2015-02-03 – 2015-02-04 (×4): 8 [IU] via SUBCUTANEOUS
  Filled 2015-02-02 (×9): qty 1

## 2015-02-02 NOTE — ED Notes (Signed)
Dr Shela Commons and Renata Caprice NP in w/ pt.

## 2015-02-02 NOTE — BH Assessment (Signed)
Spoke with Annice Pih at [facility] who reported that pt was denied due to behavioral acuity. TTS will continue to seek placement at other facilities.
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
## 2015-02-02 NOTE — ED Notes (Signed)
Dr J and Conrad NP into see 

## 2015-02-02 NOTE — ED Notes (Signed)
.  .  .        xx

## 2015-02-02 NOTE — ED Notes (Signed)
Report received from Luis Rambo RN. Pt. Alert and oriented in no distress denies SI, HI, AVH and pain.  Pt. Instructed to come to me with problems or concerns.Will continue to monitor for safety via security cameras and Q 15 minute checks. 

## 2015-02-02 NOTE — ED Notes (Signed)
Pt. Noted in room. No complaints or concerns voiced. No distress or abnormal behavior noted. Will continue to monitor with security cameras. Q 15 minute rounds continue. 

## 2015-02-02 NOTE — ED Notes (Signed)
Pt. Noted sleeping in room. No complaints or concerns voiced. No distress or abnormal behavior noted. Will continue to monitor with security cameras. Q 15 minute rounds continue. 

## 2015-02-02 NOTE — Progress Notes (Addendum)
Disposition CSW completed referrals for the following inpatient psych facilities:  [facility](refaxed as admissions stated they had no record of referral)  CSW will continue to follow up with patient's placement needs.  Seward Speck [facility] Disposition CSW [phone]

## 2015-02-02 NOTE — ED Notes (Signed)
Pt. To [facility] from ED ambulatory without difficulty, to room 37 . Report from [facility]. Pt. Is alert and oriented, warm and dry in no distress. Pt. Denies HI, and AVH. Pt. Does have SI "but not as strong as before". Pt. Calm and cooperative. Pt. Made aware of security cameras and Q15 minute rounds. Pt. Encouraged to let Nursing staff know of any concerns or needs.

## 2015-02-02 NOTE — BH Assessment (Signed)
Per Darreld Mclean at [facility], pt has been declined due to aggression.   Clista Bernhardt, [facility] Triage Specialist 02/02/2015 5:20 AM

## 2015-02-02 NOTE — ED Notes (Signed)
Up tot he bathroom to shower and change scrubs 

## 2015-02-03 DIAGNOSIS — F314 Bipolar disorder, current episode depressed, severe, without psychotic features: Secondary | ICD-10-CM

## 2015-02-03 DIAGNOSIS — F3130 Bipolar disorder, current episode depressed, mild or moderate severity, unspecified: Secondary | ICD-10-CM | POA: Diagnosis present

## 2015-02-03 DIAGNOSIS — R45851 Suicidal ideations: Secondary | ICD-10-CM | POA: Diagnosis not present

## 2015-02-03 LAB — CBG MONITORING, ED
GLUCOSE-CAPILLARY: 101 mg/dL — AB (ref 65–99)
GLUCOSE-CAPILLARY: 213 mg/dL — AB (ref 65–99)
Glucose-Capillary: 237 mg/dL — ABNORMAL HIGH (ref 65–99)
Glucose-Capillary: 80 mg/dL (ref 65–99)
Glucose-Capillary: 109 mg/dL — ABNORMAL HIGH (ref 65–99)
Glucose-Capillary: 114 mg/dL — ABNORMAL HIGH (ref 65–99)

## 2015-02-03 MED ORDER — ARIPIPRAZOLE 5 MG PO TABS
5.0000 mg | ORAL_TABLET | Freq: Every day | ORAL | Status: DC
  Administered 2015-02-03 – 2015-02-04 (×2): 5 mg via ORAL
  Filled 2015-02-03 (×2): qty 1

## 2015-02-03 MED ORDER — AMPHETAMINE-DEXTROAMPHET ER 10 MG PO CP24
20.0000 mg | ORAL_CAPSULE | Freq: Every day | ORAL | Status: DC
  Administered 2015-02-04: 20 mg via ORAL
  Filled 2015-02-03 (×2): qty 2

## 2015-02-03 NOTE — ED Notes (Signed)
Up to the bathroom 

## 2015-02-03 NOTE — ED Notes (Signed)
Pt. Noted sleeping in room. No complaints or concerns voiced. No distress or abnormal behavior noted. Will continue to monitor with security cameras. Q 15 minute rounds continue. 

## 2015-02-03 NOTE — ED Notes (Signed)
Pt. Noted in room with visitor. No complaints or concerns voiced. No distress or abnormal behavior noted. Will continue to monitor with security cameras. Q 15 minute rounds continue.  

## 2015-02-03 NOTE — ED Notes (Signed)
Report received from Janie Rambo RN. Pt. Alert and oriented in no distress denies SI, HI, AVH and pain.  Pt. Instructed to come to me with problems or concerns.Will continue to monitor for safety via security cameras and Q 15 minute checks. 

## 2015-02-03 NOTE — ED Notes (Signed)
CBG 237 

## 2015-02-03 NOTE — ED Notes (Signed)
Dr zammit updated w/ CBG give 4 units insulin VORB

## 2015-02-03 NOTE — ED Notes (Signed)
Dr Mervyn Skeeters and jamison into see

## 2015-02-03 NOTE — ED Notes (Signed)
Pt. Noted in room. No complaints or concerns voiced. No distress or abnormal behavior noted. Will continue to monitor with security cameras. Q 15 minute rounds continue. 

## 2015-02-03 NOTE — ED Notes (Signed)
On the phone 
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
## 2015-02-03 NOTE — ED Notes (Signed)
Up in room sretching, nad.

## 2015-02-04 DIAGNOSIS — R45851 Suicidal ideations: Secondary | ICD-10-CM | POA: Diagnosis not present

## 2015-02-04 LAB — CBG MONITORING, ED
GLUCOSE-CAPILLARY: 57 mg/dL — AB (ref 65–99)
GLUCOSE-CAPILLARY: 251 mg/dL — AB (ref 65–99)
Glucose-Capillary: 247 mg/dL — ABNORMAL HIGH (ref 65–99)
Glucose-Capillary: 252 mg/dL — ABNORMAL HIGH (ref 65–99)
Glucose-Capillary: 225 mg/dL — ABNORMAL HIGH (ref 65–99)

## 2015-02-04 NOTE — ED Notes (Signed)
Pt. Noted sleeping in room. No complaints or concerns voiced. No distress or abnormal behavior noted. Will continue to monitor with security cameras. Q 15 minute rounds continue. 

## 2015-02-04 NOTE — ED Notes (Signed)
Report called to [employer]. [facility],  Pending [facility] transport in 1 hr.

## 2015-02-04 NOTE — ED Notes (Signed)
Pt. C/o his blood sugar being low. CBG done and snack given.
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
## 2015-02-04 NOTE — ED Notes (Signed)
Pt AAO x 3, in dayroom watching TV at present, no distress noted, calm & cooperative, monitoring for safety, Q 15 min checks in effect.

## 2015-02-04 NOTE — Progress Notes (Signed)
Inpatient Diabetes Program Recommendations  AACE/ADA: New Consensus Statement on Inpatient Glycemic Control (2013)  Target Ranges:  Prepandial:   less than 140 mg/dL      Peak postprandial:   less than 180 mg/dL (1-2 hours)      Critically ill patients:  140 - 180 mg/dL   Reason for Assessment: Hyperglycemia and Hypoglycemia  Diabetes history: DM1 Outpatient Diabetes medications: Lantus 33 units QHS and Novolog 10-15 units tidwc Current orders for Inpatient glycemic control: Lantus 33 units QHS and Novolog 8 units tidwc  Results for [name] (MRN [id number]) as of 02/04/2015 13:48  Ref. Range 02/03/2015 17:49 02/03/2015 21:15 02/04/2015 04:44 02/04/2015 08:15 02/04/2015 12:23  Glucose-Capillary Latest Ref Range: 65-99 mg/dL 641 (H) 583 (H) 57 (L) 251 (H) 247 (H)   Needs correction insulin and decrease in basal to prevent hypoglycemia.  Inpatient Diabetes Program Recommendations Insulin - Basal: Decrease Lantus to 30 units QHS Correction (SSI): Add Novolog sensitive tidwc and hs HgbA1C: Check HgbA1C to assess glycemic control prior to hospitalization  Note: Will follow while in ED. Thank you. Ailene Ards, RD, LDN, CDE Inpatient Diabetes Coordinator [phone]

## 2015-02-04 NOTE — Progress Notes (Signed)
pcp is [facility] [address] [place], [place] [address] [phone]

## 2015-02-04 NOTE — ED Notes (Addendum)
Pt alert and oriented.  Denies HI and SI and AVH.  He said he had been overwhelmed lately because of all his physical ailments.  He is cooperative and taking his meds.  Remains safe on unit with 15 minute checks in place.

## 2015-02-05 ENCOUNTER — Encounter (HOSPITAL_COMMUNITY): Payer: Self-pay | Admitting: *Deleted

## 2015-02-05 ENCOUNTER — Inpatient Hospital Stay (HOSPITAL_COMMUNITY)
Admission: EM | Admit: 2015-02-05 | Discharge: 2015-02-08 | DRG: 885 | Disposition: A | Discharge status: Home / Self Care | Payer: Medicaid Other | Source: Intra-hospital | Attending: Psychiatry | Admitting: Psychiatry

## 2015-02-05 DIAGNOSIS — E871 Hypo-osmolality and hyponatremia: Secondary | ICD-10-CM | POA: Diagnosis present

## 2015-02-05 DIAGNOSIS — E039 Hypothyroidism, unspecified: Secondary | ICD-10-CM | POA: Diagnosis present

## 2015-02-05 DIAGNOSIS — F6381 Intermittent explosive disorder: Secondary | ICD-10-CM | POA: Insufficient documentation

## 2015-02-05 DIAGNOSIS — F6089 Other specific personality disorders: Secondary | ICD-10-CM | POA: Diagnosis present

## 2015-02-05 DIAGNOSIS — Z794 Long term (current) use of insulin: Secondary | ICD-10-CM | POA: Diagnosis not present

## 2015-02-05 DIAGNOSIS — Z833 Family history of diabetes mellitus: Secondary | ICD-10-CM | POA: Diagnosis not present

## 2015-02-05 DIAGNOSIS — E1065 Type 1 diabetes mellitus with hyperglycemia: Secondary | ICD-10-CM | POA: Diagnosis present

## 2015-02-05 DIAGNOSIS — R45851 Suicidal ideations: Secondary | ICD-10-CM | POA: Diagnosis present

## 2015-02-05 DIAGNOSIS — E875 Hyperkalemia: Secondary | ICD-10-CM | POA: Diagnosis not present

## 2015-02-05 DIAGNOSIS — F3112 Bipolar disorder, current episode manic without psychotic features, moderate: Principal | ICD-10-CM | POA: Diagnosis present

## 2015-02-05 DIAGNOSIS — IMO0002 Reserved for concepts with insufficient information to code with codable children: Secondary | ICD-10-CM | POA: Diagnosis present

## 2015-02-05 DIAGNOSIS — R404 Transient alteration of awareness: Secondary | ICD-10-CM | POA: Diagnosis not present

## 2015-02-05 DIAGNOSIS — E063 Autoimmune thyroiditis: Secondary | ICD-10-CM | POA: Diagnosis present

## 2015-02-05 LAB — GLUCOSE, CAPILLARY
GLUCOSE-CAPILLARY: 304 mg/dL — AB (ref 65–99)
GLUCOSE-CAPILLARY: 225 mg/dL — AB (ref 65–99)
Glucose-Capillary: 383 mg/dL — ABNORMAL HIGH (ref 65–99)
Glucose-Capillary: 323 mg/dL — ABNORMAL HIGH (ref 65–99)
Glucose-Capillary: 339 mg/dL — ABNORMAL HIGH (ref 65–99)
Glucose-Capillary: 256 mg/dL — ABNORMAL HIGH (ref 65–99)

## 2015-02-05 MED ORDER — INSULIN ASPART 100 UNIT/ML ~~LOC~~ SOLN
0.0000 [IU] | Freq: Three times a day (TID) | SUBCUTANEOUS | Status: DC
  Administered 2015-02-05: 7 [IU] via SUBCUTANEOUS
  Administered 2015-02-06: 9 [IU] via SUBCUTANEOUS

## 2015-02-05 MED ORDER — INSULIN GLARGINE 100 UNIT/ML ~~LOC~~ SOLN: 30.0000 [IU] | Freq: Every day | SUBCUTANEOUS | Status: DC

## 2015-02-05 MED ORDER — PAROXETINE HCL 20 MG PO TABS
20.0000 mg | ORAL_TABLET | Freq: Every day | ORAL | Status: DC
  Administered 2015-02-05: 20 mg via ORAL
  Filled 2015-02-05 (×3): qty 1

## 2015-02-05 MED ORDER — ACETAMINOPHEN 325 MG PO TABS: 650.0000 mg | ORAL_TABLET | Freq: Four times a day (QID) | ORAL | Status: DC | PRN

## 2015-02-05 MED ORDER — LURASIDONE HCL 40 MG PO TABS
20.0000 mg | ORAL_TABLET | Freq: Every day | ORAL | Status: DC
  Administered 2015-02-06 – 2015-02-08 (×3): 20 mg via ORAL
  Filled 2015-02-05 (×4): qty 1
  Filled 2015-02-05: qty 3
  Filled 2015-02-05 (×2): qty 1

## 2015-02-05 MED ORDER — TRAZODONE HCL 50 MG PO TABS
50.0000 mg | ORAL_TABLET | Freq: Every evening | ORAL | Status: DC | PRN
  Administered 2015-02-05: 50 mg via ORAL
  Filled 2015-02-05 (×7): qty 1

## 2015-02-05 MED ORDER — LORAZEPAM 1 MG PO TABS
1.0000 mg | ORAL_TABLET | Freq: Four times a day (QID) | ORAL | Status: DC | PRN
  Administered 2015-02-05 – 2015-02-06 (×2): 1 mg via ORAL
  Filled 2015-02-05 (×2): qty 1

## 2015-02-05 MED ORDER — AMPHETAMINE-DEXTROAMPHET ER 10 MG PO CP24
20.0000 mg | ORAL_CAPSULE | Freq: Two times a day (BID) | ORAL | Status: DC
  Administered 2015-02-05: 20 mg via ORAL
  Filled 2015-02-05: qty 2

## 2015-02-05 MED ORDER — MAGNESIUM HYDROXIDE 400 MG/5ML PO SUSP: 30.0000 mL | Freq: Every day | ORAL | Status: DC | PRN

## 2015-02-05 MED ORDER — DIVALPROEX SODIUM ER 500 MG PO TB24
500.0000 mg | ORAL_TABLET | Freq: Every day | ORAL | Status: DC
  Administered 2015-02-05: 500 mg via ORAL
  Filled 2015-02-05 (×4): qty 1

## 2015-02-05 MED ORDER — ALUM & MAG HYDROXIDE-SIMETH 200-200-20 MG/5ML PO SUSP: 30.0000 mL | ORAL | Status: DC | PRN

## 2015-02-05 MED ORDER — INSULIN ASPART 100 UNIT/ML ~~LOC~~ SOLN: 4.0000 [IU] | Freq: Three times a day (TID) | SUBCUTANEOUS | Status: DC

## 2015-02-05 MED ORDER — INSULIN ASPART 100 UNIT/ML ~~LOC~~ SOLN
0.0000 [IU] | Freq: Every day | SUBCUTANEOUS | Status: DC
  Administered 2015-02-05 – 2015-02-06 (×2): 3 [IU] via SUBCUTANEOUS

## 2015-02-05 MED ORDER — LISINOPRIL 10 MG PO TABS
10.0000 mg | ORAL_TABLET | Freq: Every day | ORAL | Status: DC
  Administered 2015-02-05 – 2015-02-06 (×2): 10 mg via ORAL
  Filled 2015-02-05 (×2): qty 1
  Filled 2015-02-05: qty 2
  Filled 2015-02-05 (×2): qty 1

## 2015-02-05 MED ORDER — ZIPRASIDONE HCL 20 MG PO CAPS: 20.0000 mg | ORAL_CAPSULE | Freq: Two times a day (BID) | ORAL | Status: DC

## 2015-02-05 MED ORDER — LEVOTHYROXINE SODIUM 75 MCG PO TABS
75.0000 ug | ORAL_TABLET | ORAL | Status: DC
  Administered 2015-02-05 – 2015-02-07 (×2): 75 ug via ORAL
  Filled 2015-02-05 (×4): qty 1

## 2015-02-05 MED ORDER — HYDROXYZINE HCL 25 MG PO TABS
25.0000 mg | ORAL_TABLET | Freq: Four times a day (QID) | ORAL | Status: DC | PRN
  Administered 2015-02-07: 25 mg via ORAL
  Filled 2015-02-05: qty 1
  Filled 2015-02-05: qty 10

## 2015-02-05 MED ORDER — INSULIN GLARGINE 100 UNIT/ML ~~LOC~~ SOLN: 33.0000 [IU] | Freq: Every day | SUBCUTANEOUS | Status: DC

## 2015-02-05 MED ORDER — PAROXETINE HCL 10 MG PO TABS
10.0000 mg | ORAL_TABLET | Freq: Every day | ORAL | Status: DC
  Administered 2015-02-05 – 2015-02-07 (×3): 10 mg via ORAL
  Filled 2015-02-05 (×5): qty 1
  Filled 2015-02-05: qty 3

## 2015-02-05 MED ORDER — INSULIN ASPART 100 UNIT/ML ~~LOC~~ SOLN
0.0000 [IU] | Freq: Three times a day (TID) | SUBCUTANEOUS | Status: DC
  Administered 2015-02-05: 5 [IU] via SUBCUTANEOUS
  Administered 2015-02-05: 11 [IU] via SUBCUTANEOUS

## 2015-02-05 MED ORDER — QUETIAPINE FUMARATE 25 MG PO TABS: 25.0000 mg | ORAL_TABLET | Freq: Two times a day (BID) | ORAL | Status: DC

## 2015-02-05 MED ORDER — INSULIN ASPART 100 UNIT/ML ~~LOC~~ SOLN
0.0000 [IU] | Freq: Every day | SUBCUTANEOUS | Status: DC
  Administered 2015-02-05: 5 [IU] via SUBCUTANEOUS

## 2015-02-05 NOTE — [facility] Group Notes (Signed)
[facility] LCSW Group Therapy      Feelings About Diagnosis 1:15 - 2:30 PM         02/05/2015    Type of Therapy:  Group Therapy  Participation Level:  Active  Participation Quality:  Appropriate  Affect:  Appropriate  Cognitive:  Alert and Appropriate  Insight:  Developing/Improving and Engaged  Engagement in Therapy:  Developing/Improving and Engaged  Modes of Intervention:  Discussion, Education, Exploration, Problem-Solving, Rapport Building, Support  Summary of Progress/Problems:  Patient actively participated in group. Patient discussed past and present diagnosis and the effects it has had on  life.  Patient talked about family and society being judgmental and the stigma associated with having a mental health diagnosis. He shared he feels discriminated against because of his illness.   He also stated he often ask the question, "why me." Wynn Banker 02/05/2015
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
## 2015-02-05 NOTE — Tx Team (Signed)
Initial Interdisciplinary Treatment Plan   PATIENT STRESSORS: Educational concerns Financial difficulties Health problems Legal issue   PATIENT STRENGTHS: Ability for insight Communication skills General fund of knowledge Motivation for treatment/growth Supportive family/friends   PROBLEM LIST: Problem List/Patient Goals Date to be addressed Date deferred Reason deferred Estimated date of resolution  Depresion & Suicide Thoughts I was having suicide thoughts" "My agression has been very bad" 02/05/15                                                      DISCHARGE CRITERIA:  Adequate post-discharge living arrangements Medical problems require only outpatient monitoring Motivation to continue treatment in a less acute level of care Verbal commitment to aftercare and medication compliance  PRELIMINARY DISCHARGE PLAN: Return to previous living arrangement Return to previous work or school arrangements  PATIENT/FAMIILY INVOLVEMENT: This treatment plan has been presented to and reviewed with the patient, [name], and/or family member.  The patient and family have been given the opportunity to ask questions and make suggestions.  Roselie Skinner [facility] 02/05/2015, 2:36 AM

## 2015-02-05 NOTE — Progress Notes (Signed)
Adult Psychoeducational Group Note  Date:  02/05/2015 Time:  0900  Group Topic/Focus:  Recovery Goals:   The focus of this group is to identify appropriate goals for recovery and establish a plan to achieve them.  Participation Level:  appropriate  Participation Quality:  Appropriate  Affect: appropriate   Cognitive:  Appropriate  Insight: Appropriate  Engagement in Group:  Engaged  Modes of Intervention:  Education  Additional Comments:    Zahraa Bhargava L 02/05/2015, 9:21 AM

## 2015-02-05 NOTE — Progress Notes (Signed)
Results for KIRKWOOD, NAVAS (MRN [id number]) as of 02/05/2015 17:23  Ref. Range 02/05/2015 01:28 02/05/2015 02:16 02/05/2015 06:29 02/05/2015 11:54 02/05/2015 17:17  Glucose-Capillary Latest Ref Range: 65-99 mg/dL 357 (H) 017 (H) 793 (H) 323 (H) 339 (H)   May benefit from addition of Novolog 4 units tidwc for meal coverage insulin. Will continue to follow.  Thank you. Ailene Ards, RD, LDN, CDE Inpatient Diabetes Coordinator [phone]

## 2015-02-05 NOTE — [facility] Suicide Risk Assessment (Addendum)
[facility] INPATIENT:  Family/Significant Other Suicide Prevention Education  Suicide Prevention Education:  Contact Attempts. Selestino Grainer, Celine Ahr [phone]; has been identified by the patient as the family member/significant other with whom the patient will be residing, and identified as the person(s) who will aid the patient in the event of a mental health crisis.  With written consent from the patient, two attempts were made to provide suicide prevention education, prior to and/or following the patient's discharge.  We were unsuccessful in providing suicide prevention education.  A suicide education pamphlet was given to the patient to share with family/significant other.  Date and time of first attempt:  February 05, 2015 at 12:33 PM Date and time of second attempt:  February 06, 2015 at 8:45 AM  Wynn Banker 02/05/2015, 12:32 PM

## 2015-02-05 NOTE — Progress Notes (Signed)
Recreation Therapy Notes  Animal-Assisted Activity (AAA) Program Checklist/Progress Notes Patient Eligibility Criteria Checklist & Daily Group note for Rec Tx Intervention  Date: 06.21.16 Time: 2:30 pm Location: [address]  AAA/T Program Assumption of Risk Form signed by Patient/ or Parent Legal Guardian yes  Patient is free of allergies or sever asthma yes  Patient reports no fear of animals yes  Patient reports no history of cruelty to animals no  Patient understands his/her participation is voluntary yes  Patient washes hands before animal contact yes  Patient washes hands after animal contact yes  Education: Hand Washing, Appropriate Animal Interaction   Clinical Observations/Feedback: Patient did not attend group.   Caroll Rancher, LRT/CTRS         Caroll Rancher A 02/05/2015 4:15 PM
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
## 2015-02-05 NOTE — Progress Notes (Signed)
D: Pt presents anxious this morning. Pt reported depression 5/10 and stated that he's always depressed. Anxiety 5/10. Hopeless 6/10. Pt denies suicidal thoughts this morning. Pt denies any physical aggression towards anyone.Pt  observed in the dayroom this morning interacting with other pts on the unit. Pt hygiene appropriate for pt and situation. Pt compliant with taking meds and attending groups. Pt voiced concerns of taking Abilify. A: Medications administered as ordered per MD. Verbal support given. Pt encouraged to attend groups. 15 minute checks performed for safety. Pt diabetes monitored and consult ordered per MD. Meds adjusted by MD. R: Pt receptive to treatment.
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
## 2015-02-06 DIAGNOSIS — E871 Hypo-osmolality and hyponatremia: Secondary | ICD-10-CM

## 2015-02-06 DIAGNOSIS — IMO0002 Reserved for concepts with insufficient information to code with codable children: Secondary | ICD-10-CM | POA: Diagnosis present

## 2015-02-06 DIAGNOSIS — E1065 Type 1 diabetes mellitus with hyperglycemia: Secondary | ICD-10-CM | POA: Diagnosis present

## 2015-02-06 DIAGNOSIS — E875 Hyperkalemia: Secondary | ICD-10-CM

## 2015-02-06 LAB — BASIC METABOLIC PANEL
Anion gap: 7 (ref 5–15)
BUN: 20 mg/dL (ref 6–20)
CALCIUM: 9.3 mg/dL (ref 8.9–10.3)
CO2: 24 mmol/L (ref 22–32)
Chloride: 99 mmol/L — ABNORMAL LOW (ref 101–111)
Creatinine, Ser: 0.9 mg/dL (ref 0.61–1.24)
GFR calc Af Amer: >60 mL/min (ref >60)
GFR calc non Af Amer: >60 mL/min (ref >60)
GLUCOSE: 434 mg/dL — AB (ref 65–99)
Potassium: 6 mmol/L — ABNORMAL HIGH (ref 3.5–5.1)
SODIUM: 130 mmol/L — AB (ref 135–145)

## 2015-02-06 LAB — LIPID PANEL
Cholesterol: 153 mg/dL (ref 0–200)
HDL: 53 mg/dL (ref >40)
LDL Cholesterol: 88 mg/dL (ref 0–99)
Total CHOL/HDL Ratio: 2.9 RATIO
Triglycerides: 58 mg/dL (ref <150)
VLDL: 12 mg/dL (ref 0–40)

## 2015-02-06 LAB — GLUCOSE, CAPILLARY
GLUCOSE-CAPILLARY: 395 mg/dL — AB (ref 65–99)
Glucose-Capillary: 499 mg/dL — ABNORMAL HIGH (ref 65–99)
Glucose-Capillary: 277 mg/dL — ABNORMAL HIGH (ref 65–99)

## 2015-02-06 LAB — TSH: TSH: 1.182 u[IU]/mL (ref 0.350–4.500)

## 2015-02-06 MED ORDER — INSULIN ASPART 100 UNIT/ML ~~LOC~~ SOLN: 4.0000 [IU] | Freq: Three times a day (TID) | SUBCUTANEOUS | Status: DC

## 2015-02-06 MED ORDER — INSULIN ASPART 100 UNIT/ML ~~LOC~~ SOLN: 0.0000 [IU] | Freq: Three times a day (TID) | SUBCUTANEOUS | Status: DC

## 2015-02-06 MED ORDER — INSULIN ASPART 100 UNIT/ML ~~LOC~~ SOLN
13.0000 [IU] | Freq: Once | SUBCUTANEOUS | Status: AC
  Administered 2015-02-06: 13 [IU] via SUBCUTANEOUS

## 2015-02-06 MED ORDER — SODIUM POLYSTYRENE SULFONATE 15 GM/60ML PO SUSP
30.0000 g | Freq: Once | ORAL | Status: AC
  Administered 2015-02-06: 30 g via ORAL
  Filled 2015-02-06: qty 120

## 2015-02-06 MED ORDER — TRAZODONE HCL 50 MG PO TABS
50.0000 mg | ORAL_TABLET | Freq: Every evening | ORAL | Status: DC | PRN
  Administered 2015-02-06 – 2015-02-07 (×2): 50 mg via ORAL
  Filled 2015-02-06: qty 3
  Filled 2015-02-06: qty 1

## 2015-02-06 MED ORDER — INSULIN ASPART 100 UNIT/ML ~~LOC~~ SOLN
4.0000 [IU] | Freq: Three times a day (TID) | SUBCUTANEOUS | Status: DC
  Administered 2015-02-06 – 2015-02-07 (×3): 4 [IU] via SUBCUTANEOUS

## 2015-02-06 MED ORDER — INSULIN ASPART 100 UNIT/ML ~~LOC~~ SOLN
0.0000 [IU] | Freq: Three times a day (TID) | SUBCUTANEOUS | Status: DC
  Administered 2015-02-06: 15 [IU] via SUBCUTANEOUS
  Administered 2015-02-07 (×2): 11 [IU] via SUBCUTANEOUS
  Administered 2015-02-07: 5 [IU] via SUBCUTANEOUS
  Administered 2015-02-08: 15 [IU] via SUBCUTANEOUS
  Administered 2015-02-08: 2 [IU] via SUBCUTANEOUS

## 2015-02-06 MED ORDER — DIVALPROEX SODIUM ER 500 MG PO TB24
500.0000 mg | ORAL_TABLET | Freq: Two times a day (BID) | ORAL | Status: DC
  Administered 2015-02-06 – 2015-02-08 (×5): 500 mg via ORAL
  Filled 2015-02-06 (×3): qty 1
  Filled 2015-02-06: qty 6
  Filled 2015-02-06 (×6): qty 1
  Filled 2015-02-06: qty 6

## 2015-02-06 MED ORDER — INSULIN GLARGINE 100 UNIT/ML ~~LOC~~ SOLN
42.0000 [IU] | Freq: Every day | SUBCUTANEOUS | Status: DC
  Administered 2015-02-06 – 2015-02-07 (×2): 42 [IU] via SUBCUTANEOUS
  Filled 2015-02-06: qty 0.42

## 2015-02-06 NOTE — Progress Notes (Signed)
D   Pt has been pleasant and cooperatvie   He tends to isolate and has minimal interaction with others   He administers his own insulin and is familiar with diet regimen     Pt has been compliant with treatment   Pt does attend some groups and he is pleasant on approach A   Verbal support given   Medications administered and effectiveness monitored    Q 15 min checks   Discussed health issues with pt R   Pt is safe at present

## 2015-02-06 NOTE — Progress Notes (Signed)
Hyperglycemic event: CBG at 1215- 499 Pt Complaints- Blurred vision, light headed upon standing. Steady gait. Hypotension and bradycardic (See docflowsheet-vitals) Current order parameters not met for Insulin coverage, NP Shuvon made aware. Verbal orders taken with readback, placed, acknowledged, and medication administered. Pt began eating lunch meal after medication administration. Follow up: Pt denies symptoms after he ate lunch meal. Reports feeling better.

## 2015-02-06 NOTE — Progress Notes (Signed)
D   Pt has been pleasant and cooperatvie   He tends to isolate and has minimal interaction with others   He expressed concern over his insulin not being started until Wednesday night and said his CBG would be high in the morning   Pt has been compliant with treatment A   Verbal support given   Medications administered and effectiveness monitored    Q 15 min checks R   Pt is safe at present

## 2015-02-06 NOTE — Progress Notes (Signed)
D: Patient is alert and oriented. Pt's mood and affect is depressed, pleasant upon interaction, and appropriate to circumstance. Pt denies SI/HI and AVH. Pt rates depression and anxiety 3/10, hopelessness 5/10. Pt reports his goal for the day is "understanding/letting go of my horrendous past, to move forward and better myself." Pt is requesting to speak with spiritual care today. Pt is requesting information on living will. Pt is requesting medication education print outs. See additional note for hyperglycemic event at 1215 and 1715. Pt hypotensive and bradycardic today, pt complains of light headedness upon standing. Pt is attending unit groups. A: Active listening by RN. Encouragement/Support provided to pt. Spiritual care consult placed per protocol, pt encouraged to discuss living will options with [facility]. rPh Michelle Nasuti contacted and will provide pt with medication print out. Fluids given when hypotensive, will reassess BP and pulse frequently, NP May made aware of pt's hypotension. Medication education reviewed with pt. Scheduled medications administered per providers orders (See MAR). 15 minute checks continued per protocol for patient safety.  R: Patient cooperative and receptive to nursing interventions. Pt remains safe.

## 2015-02-06 NOTE — Progress Notes (Signed)
Inpatient Diabetes Program Recommendations  AACE/ADA: New Consensus Statement on Inpatient Glycemic Control (2013)  Target Ranges:  Prepandial:   less than 140 mg/dL      Peak postprandial:   less than 180 mg/dL (1-2 hours)      Critically ill patients:  140 - 180 mg/dL      Results for GIOVANNE, LINNANE (MRN [id number]) as of 02/06/2015 14:51  Ref. Range 02/05/2015 01:28 02/05/2015 02:16 02/05/2015 06:29 02/05/2015 11:54 02/05/2015 17:17 02/05/2015 20:32 02/06/2015 06:11 02/06/2015 12:15  Glucose-Capillary Latest Ref Range: 65-99 mg/dL 825 (H) 053 (H) 976 (H) 323 (H) 339 (H) 256 (H) 395 (H) 499 (H)   Blood sugars continue to be elevated. Did not receive basal insulin on 4/21. To start Lantus 30 units QHS tonight. Will likely benefit from addition of Novolog 4 units tidwc for meal coverage insulin.  Will continue to follow. Thank you. Ailene Ards, RD, LDN, CDE Inpatient Diabetes Coordinator [phone]

## 2015-02-06 NOTE — Progress Notes (Signed)
Hyperglycemic event: CBG at 1715- 588 Patient complaints- None, denies symptoms NP May made aware of pt's CBG at this time. New orders placed, acknowledged, and medication administered per providers orders (See MAR). Pt began eating dinner meal after medication administration.

## 2015-02-06 NOTE — Consult Note (Signed)
Requesting physician: Nehemiah Massed, M.D.  Reason for consultation: Uncontrolled blood glucose  History of Present Illness: 19 y/o male  with type 1 uncontrolled DM with hx of DKA ( last A1C of 8.3 checked 6 months back per pt) , bipolar disorder, autoimmune thyroiditis, admitted to [facility] with manic episode on 02/05/2015.  pt did not receive his lantus yesterday. reports that he was taking about 58 u lantus previously which was changed to 33 units by his PCP about 18 months back. fsg at home usually runs 150-250. Pt on lantus and novolog SSI at home. He reports some pain to his left foot for past several weeks, but denies any blurry vision, headache, polyuria, polydipsia , diaphoresis, weight loss and tingling or numbness of extremities. Denies chest pain, palpitations, SOB, fever, chills, nausea, vomiting, abd pain, bowel or urinary symptoms.  Reports being compliant with meds.  hospitalist consulted as pts fsg was markedly elevated upto 500 and elevated potassium  Allergies:  No Known Allergies    Past Medical History  Diagnosis Date  . Hypertension   . Hypothyroidism   . Type 1 diabetes mellitus not at goal   . Goiter   . Physical growth delay   . Thyroiditis, autoimmune   . Hypertension   . Tachycardia   . Diabetic arthropathy   . Diabetes mellitus     Dec 2007  . Hypoglycemia associated with diabetes   . Vision abnormalities glasses  . Mononucleosis   . ADHD (attention deficit hyperactivity disorder)     Past Surgical History  Procedure Laterality Date  . Tonsillectomy  at young age    Medications:  Scheduled Meds: . divalproex  500 mg Oral QHS  . insulin aspart  0-15 Units Subcutaneous TID WC  . insulin aspart  0-5 Units Subcutaneous QHS  . insulin aspart  4 Units Subcutaneous TID WC  . insulin glargine  30 Units Subcutaneous QHS  . levothyroxine  75 mcg Oral QODAY  . lurasidone  20 mg Oral Q breakfast  . PARoxetine  10 mg Oral QHS  . traZODone  50 mg Oral  QHS,MR X 1   Continuous Infusions:  PRN Meds:.acetaminophen, alum & mag hydroxide-simeth, hydrOXYzine, LORazepam, magnesium hydroxide  Social History:  reports that he has never smoked. He has never used smokeless tobacco. He reports that he does not drink alcohol or use illicit drugs.   Family hx : no hx of DM, heart diease.  Family History  Problem Relation Age of Onset  . Stroke Mother   . COPD Father   . Diabetes Maternal Grandmother     Review of Systems:  As outlined in HPI. 12 pt ROS unremarkable   Physical Exam:  Filed Vitals:   02/05/15 0701 02/06/15 0629 02/06/15 0630 02/06/15 1228  BP: 114/63 105/67 111/54 115/55  Pulse: 108 60 74 53  Temp:  97.4 F (36.3 C)    TempSrc:  Oral    Resp:  16    Height:      Weight:        No intake or output data in the 24 hours ending 02/06/15 1739  General: young male in  no acute distress. HEENT: no pallor, moist mucosa, supple neck,  Heart: Regular rate and rhythm, without murmurs, rubs, gallops. Lungs: Clear to auscultation bilaterally. Abdomen: Soft, nontender, nondistended,  Extremities: warm, no edema Neuro: alert and oriented  Labs on Admission:  CBC:    Component Value Date/Time   WBC 7.9 02/01/2015 2056   HGB  14.2 02/01/2015 2056   HCT 41.1 02/01/2015 2056   PLT 211 02/01/2015 2056   MCV 83.5 02/01/2015 2056   NEUTROABS 3.5 06/12/2014 1940   LYMPHSABS 2.6 06/12/2014 1940   MONOABS 0.5 06/12/2014 1940   EOSABS 0.2 06/12/2014 1940   BASOSABS 0.0 06/12/2014 1940    Basic Metabolic Panel:    Component Value Date/Time   NA 130* 02/06/2015 0613   K 6.0* 02/06/2015 0613   CL 99* 02/06/2015 0613   CO2 24 02/06/2015 0613   BUN 20 02/06/2015 0613   CREATININE 0.90 02/06/2015 0613   CREATININE 0.89 07/15/2013 0851   GLUCOSE 434* 02/06/2015 0613   CALCIUM 9.3 02/06/2015 0613    Radiological Exams on Admission: No results found.  Assessment/Plan Uncontrolled type 1 DM with hyperglycemia  possibly  in the setting of missed lantus dose on admission.  No signs fo AG on labs. Will increase lantus to 42 units daily and add premeal aspart 4 u tid. Check A1C.  continue SSI. Repeat BMET in am. Will follow.   Hyperkalemia Discontinued lisinopril. Check EKG. Given 30 gm kayexalate. Repeat labs in am.   hyponatremia  possibly mild dehydration. Corrected Na is normal. , encouraged po intake.   Bipolar 1 disorder with manic symptoms  management per primary.  Autoimmune thyroiditis  continue synthroid  Diet: diabetic   hospitalists  will continue  to follow.  Time Spent on Admission: 55 minutes  Jizel Cheeks 02/06/2015, 5:39 PM

## 2015-02-06 NOTE — [facility] Suicide Risk Assessment (Signed)
[facility] INPATIENT:  Family/Significant Other Suicide Prevention Education  Suicide Prevention Education:  Education Completed; Than Forsey, [place], [phone];   has been identified by the patient as the family member/significant other with whom the patient will be residing, and identified as the person(s) who will aid the patient in the event of a mental health crisis (suicidal ideations/suicide attempt).  With written consent from the patient, the family member/significant other has been provided the following suicide prevention education, prior to the and/or following the discharge of the patient.  The suicide prevention education provided includes the following:  Suicide risk factors  Suicide prevention and interventions  National Suicide Hotline telephone number  [facility] assessment telephone number  [facility] Emergency Assistance 911  [facility] and/or Residential Mobile Crisis Unit telephone number  Request made of family/significant other to:  Remove weapons (e.g., guns, rifles, knives), all items previously/currently identified as safety concern.   Aunt advised patient does not have access to weapons.     Remove drugs/medications (over-the-counter, prescriptions, illicit drugs), all items previously/currently identified as a safety concern.  The family member/significant other verbalizes understanding of the suicide prevention education information provided.  The family member/significant other agrees to remove the items of safety concern listed above.  Wynn Banker 02/06/2015, 11:10 AM

## 2015-02-06 NOTE — Progress Notes (Signed)
Pt attended wrap up group. Pt rated his day a 10 out of 10 and said that "today was his best day so far here." Pt said something he wants to do differently after discharge is find a better job and change his life around. Pt shared a good part of his day was finding out good news about a job opportunity.

## 2015-02-06 NOTE — Progress Notes (Signed)
Recreation Therapy Notes  Date: 06.22.16 Time: 9:30 am Location: [address] Dayroom  Group Topic: Stress Management  Goal Area(s) Addresses:  Patient will verbalize importance of using healthy stress management.  Patient will identify positive emotions associated with healthy stress management.   Intervention: Guided Imagery Script  Activity :  LRT will introduce and educate patient on the stress management technique of guided imagery.  A script was used to deliver the technique to patients.  Patients were asked to follow the script read aloud by the LRT to engage in practicing guided imagery.  Education:  Stress Management, Discharge Planning.   Clinical Observations/Feedback: Patient did not attend group.  Caroll Rancher, LRT/CTRS         Caroll Rancher A 02/06/2015 4:46 PM

## 2015-02-06 NOTE — [facility] Group Notes (Signed)
[facility] LCSW Group Therapy  Emotional Regulation 1:15 - 2: 30 PM        02/06/2015     Type of Therapy:  Group Therapy  Participation Level:  Appropriate  Participation Quality:  Appropriate  Affect:  Appropriate  Cognitive:  Appropriate  Insight:  Developing/Improving   Engagement in Therapy:  Developing/Improving  Modes of Intervention:  Discussion Exploration Problem-Solving Supportive  Summary of Progress/Problems:  Group topic was emotional regulations.  Patient participated in the discussion and was able to identify fear as the emotion to be regulated.  Patient was able to identify approprite coping skills taking time to think before acting.  Wynn Banker 02/06/2015
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
## 2015-02-06 NOTE — [facility] Group Notes (Signed)
[facility] LCSW Aftercare Discharge Planning Group Note   02/06/2015 10:57 AM    Participation Quality:  Appropraite  Mood/Affect:  Appropriate  Depression Rating:  4  Anxiety Rating:  6  Thoughts of Suicide:  No  Will you contract for safety?   NA  Current AVH:  No  Plan for Discharge/Comments:  Patient attended discharge planning group and actively participated in group. He reports he is followed by [facility].  Suicide prevention education reviewed and SPE document provided.   Transportation Means: Patient has transportation.   Supports:  Patient has a support system.   Adenike Shidler, Joesph July

## 2015-02-07 DIAGNOSIS — E1065 Type 1 diabetes mellitus with hyperglycemia: Secondary | ICD-10-CM

## 2015-02-07 LAB — GLUCOSE, CAPILLARY
GLUCOSE-CAPILLARY: 588 mg/dL — AB (ref 65–99)
GLUCOSE-CAPILLARY: 237 mg/dL — AB (ref 65–99)
GLUCOSE-CAPILLARY: 308 mg/dL — AB (ref 65–99)
GLUCOSE-CAPILLARY: 125 mg/dL — AB (ref 65–99)
Glucose-Capillary: 336 mg/dL — ABNORMAL HIGH (ref 65–99)

## 2015-02-07 LAB — BASIC METABOLIC PANEL
Anion gap: 8 (ref 5–15)
BUN: 23 mg/dL — AB (ref 6–20)
CHLORIDE: 99 mmol/L — AB (ref 101–111)
CO2: 29 mmol/L (ref 22–32)
CREATININE: 0.83 mg/dL (ref 0.61–1.24)
Calcium: 9.2 mg/dL (ref 8.9–10.3)
GFR calc non Af Amer: >60 mL/min (ref >60)
GLUCOSE: 247 mg/dL — AB (ref 65–99)
Potassium: 3.9 mmol/L (ref 3.5–5.1)
Sodium: 136 mmol/L (ref 135–145)

## 2015-02-07 LAB — HEMOGLOBIN A1C
Hgb A1c MFr Bld: 12.9 % — ABNORMAL HIGH (ref 4.8–5.6)
MEAN PLASMA GLUCOSE: 324 mg/dL

## 2015-02-07 MED ORDER — INSULIN ASPART 100 UNIT/ML ~~LOC~~ SOLN
8.0000 [IU] | Freq: Three times a day (TID) | SUBCUTANEOUS | Status: DC
  Administered 2015-02-07 – 2015-02-08 (×4): 8 [IU] via SUBCUTANEOUS

## 2015-02-07 NOTE — [facility] Group Notes (Signed)
[facility] LCSW Group Therapy  [facility] 1:15 - 2:30 PM  02/07/2015   3:45 PM   Type of Therapy:  Group Therapy  Participation Level: Active  Participation Quality:  Attentive  Affect:  Appropriate  Cognitive:  Appropriate  Insight:  Developing/Improving   Engagement in Therapy:  Developing/Improving   Modes of Intervention:  Discussion, Education, Exploration, Problem-Solving, Rapport Building, Support   Summary of Progress/Problems:   Patient was attentive to speaker from the [facility] as he shared his story of dealing with mental health/substance abuse issues and overcoming it by working a recovery program.  Patient expressed interest in their programs and services and received information on their agency.  Patient shared he plans to follow up with [facility] at discharge.  Wynn Banker 02/07/2015 3:45 PM

## 2015-02-07 NOTE — Progress Notes (Signed)
D: Pts mood is pleasant and affect is appropriate.  Per self inventory, pt states slept good last night and has good appetite.  Per self inventory, pt's goal today is to talk with doctor about his discharge plan.  Pt attended group this AM and participated appropriately.  Pt verbalizes no complaints at this time.    A: Patient given emotional support from RN. Patient encouraged to come to staff with concerns and/or questions. Patient's medication routine continued. Patient's orders and plan of care reviewed. Will continue to monitor patient q15 minutes for safety.   R: Patient remains appropriate and cooperative.

## 2015-02-07 NOTE — [facility] Group Notes (Signed)
Adult Psychoeducational Group Note  Date:  02/07/2015 Time:  9:41 AM  Group Topic/Focus:  Self Care:   The focus of this group is to help patients understand the importance of self-care in order to improve or restore emotional, physical, spiritual, interpersonal, and financial health.  Participation Level:  Active  Participation Quality:  Appropriate  Affect:  Appropriate  Cognitive:  Alert and Appropriate  Insight: Appropriate  Engagement in Group:  Engaged  Modes of Intervention:  Activity, Discussion, Education and Exploration  Additional Comments:  Pt participated in group appropriately.   Bing Quarry 02/07/2015, 9:41 AM
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
## 2015-02-07 NOTE — Progress Notes (Signed)
Reviewed patient's CBG's since yesterday and the range is from 277-308 so we will increase novolog to 8 units TID [facility] Continue current lantus dose  Manson Passey [facility] [phone]

## 2015-02-07 NOTE — Consult Note (Signed)
Reason for Consult:Anger outbursts Referring Physician: Cobos  CC: Anger outbursts  HPI: [name] is an 19 y.o. male now at [facility].  Reports that since January has had outbursts of anger that he is amnestic of.  He reports no warning that these episodes are about to occur.  He has no memory of them until about 1-2 weeks later when he will have a flashback.  His flashbacks are very vivid and this is how he knows when the episode occurs.  These flashbacks are so detailed that he will see himself look at his watch during the flashback of the episode and can see the date and time.  He has had friends and family tell him about some of these events.  They report he just getting "pissed off" and he does not recall the conversation.  During one of these episodes he injured a cat.  He does not recall any bowel or bladder incontinence.  Consult called for concern of seizure.  Patient has no previous history of seizure.  These episodes occur at a frequency of about 2-3/ month but may be 2-3 in a week and then many weeks without episodes.  Last episode was the beginning of the month.   Interestingly it appears that the patient's drug use stopped at about the time that these events started.    Past Medical History  Diagnosis Date  . Hypertension   . Hypothyroidism   . Type 1 diabetes mellitus not at goal   . Goiter   . Physical growth delay   . Thyroiditis, autoimmune   . Hypertension   . Tachycardia   . Diabetic arthropathy   . Diabetes mellitus     Dec 2007  . Hypoglycemia associated with diabetes   . Vision abnormalities glasses  . Mononucleosis   . ADHD (attention deficit hyperactivity disorder)     Past Surgical History  Procedure Laterality Date  . Tonsillectomy  at young age    Family History  Problem Relation Age of Onset  . Stroke Mother   . COPD Father   . Diabetes Maternal Grandmother     Social History:  reports that he has never smoked. He has never used smokeless tobacco.  He reports that he does not drink alcohol or use illicit drugs.  No Known Allergies  Medications:  I have reviewed the patient's current medications. Prior to Admission:  Prescriptions prior to admission  Medication Sig Dispense Refill Last Dose  . amphetamine-dextroamphetamine (ADDERALL XR) 20 MG 24 hr capsule Take 20 mg by mouth 2 (two) times daily.   02/01/2015 at Unknown time  . B-D UF III MINI PEN NEEDLES 31G X 5 MM MISC BD PEN NEEDLES- BRAND SPECIFIC. USE WITH INSULIN PENS 6 X DAILY (Patient not taking: Reported on 02/01/2015) 200 each 0   . insulin aspart (NOVOLOG) 100 UNIT/ML injection Inject 10-15 Units into the skin 3 (three) times daily before meals. Per sliding scale.   02/01/2015 at Unknown time  . insulin glargine (LANTUS) 100 UNIT/ML injection Inject 33 Units into the skin at bedtime.   01/31/2015 at Unknown time  . levothyroxine (SYNTHROID, LEVOTHROID) 75 MCG tablet Take 75 mcg by mouth every other day.   01/31/2015 at Unknown time  . lisinopril (PRINIVIL,ZESTRIL) 10 MG tablet TAKE 1 TABLET BY MOUTH EVERY DAY 30 tablet 0 02/01/2015 at Unknown time  . PARoxetine (PAXIL) 20 MG tablet Take 20 mg by mouth at bedtime.   01/31/2015 at Unknown time   Scheduled: .  divalproex  500 mg Oral BID  . insulin aspart  0-15 Units Subcutaneous TID WC  . insulin aspart  0-5 Units Subcutaneous QHS  . insulin aspart  4 Units Subcutaneous TID WC  . insulin glargine  42 Units Subcutaneous QHS  . levothyroxine  75 mcg Oral QODAY  . lurasidone  20 mg Oral Q breakfast  . PARoxetine  10 mg Oral QHS    ROS: History obtained from the patient  General ROS: negative for - chills, fatigue, fever, night sweats, weight gain or weight loss Psychological ROS: as noted in HPI Ophthalmic ROS: negative for - blurry vision, double vision, eye pain or loss of vision ENT ROS: negative for - epistaxis, nasal discharge, oral lesions, sore throat, tinnitus or vertigo Allergy and Immunology ROS: negative for - hives  or itchy/watery eyes Hematological and Lymphatic ROS: negative for - bleeding problems, bruising or swollen lymph nodes Endocrine ROS: negative for - galactorrhea, hair pattern changes, polydipsia/polyuria or temperature intolerance Respiratory ROS: negative for - cough, hemoptysis, shortness of breath or wheezing Cardiovascular ROS: negative for - chest pain, dyspnea on exertion, edema or irregular heartbeat Gastrointestinal ROS: negative for - abdominal pain, diarrhea, hematemesis, nausea/vomiting or stool incontinence Genito-Urinary ROS: negative for - dysuria, hematuria, incontinence or urinary frequency/urgency Musculoskeletal ROS: tingling in last two digits on the left hand Neurological ROS: as noted in HPI Dermatological ROS: negative for rash and skin lesion changes  Physical Examination: Blood pressure 93/56, pulse 94, temperature 97.7 F (36.5 C), temperature source Oral, resp. rate 16, height  (1.753 m), weight 77.111 kg (170 lb).  HEENT-  Normocephalic, no lesions, without obvious abnormality.  Normal external eye and conjunctiva.  Normal TM's bilaterally.  Normal auditory canals and external ears. Normal external nose, mucus membranes and septum.  Normal pharynx. Cardiovascular- S1, S2 normal, pulses palpable throughout   Lungs- chest clear, no wheezing, rales, normal symmetric air entry Abdomen- soft, non-tender; bowel sounds normal; no masses,  no organomegaly Extremities- no edema Lymph-no adenopathy palpable Musculoskeletal-no joint tenderness, deformity or swelling Skin-warm and dry, no hyperpigmentation, vitiligo, or suspicious lesions  Neurological Examination Mental Status: Alert, oriented, thought content appropriate.  Speech fluent without evidence of aphasia.  Able to follow 3 step commands without difficulty. Cranial Nerves: II: Discs flat bilaterally; Visual fields grossly normal, pupils equal, round, reactive to light and accommodation III,IV, VI: ptosis  not present, extra-ocular motions intact bilaterally V,VII: smile symmetric, facial light touch sensation normal bilaterally VIII: hearing normal bilaterally IX,X: gag reflex present XI: bilateral shoulder shrug XII: midline tongue extension Motor: Right : Upper extremity   5/5    Left:     Upper extremity   5/5  Lower extremity   5/5     Lower extremity   5/5 Tone and bulk:normal tone throughout; no atrophy noted Sensory: Pinprick and light touch decreased in the last two digits of the left hand Deep Tendon Reflexes: 1+ and symmetric with absent AJ's bilaterally Plantars: Right: downgoing   Left: downgoing Cerebellar: normal finger-to-nose and normal heel-to-shin testing bilaterally Gait: normal gait and station    Laboratory Studies:   Basic Metabolic Panel:  Recent Labs Lab 02/01/15 2056 02/06/15 0613 02/07/15 0615  NA 130* 130* 136  K 4.9 6.0* 3.9  CL 94* 99* 99*  CO2 GLUCOSE 695* 434* 247*  BUN 23* 20 23*  CREATININE 1.26* 0.90 0.83  CALCIUM 8.9 9.3 9.2    Liver Function Tests:  Recent Labs Lab 02/01/15  2056  AST 22  ALT 19  ALKPHOS 111  BILITOT 1.3*  PROT 6.7  ALBUMIN 4.1   No results for input(s): LIPASE, AMYLASE in the last 168 hours. No results for input(s): AMMONIA in the last 168 hours.  CBC:  Recent Labs Lab 02/01/15 2056  WBC 7.9  HGB 14.2  HCT 41.1  MCV 83.5  PLT 211    Cardiac Enzymes: No results for input(s): CKTOTAL, CKMB, CKMBINDEX, TROPONINI in the last 168 hours.  BNP: Invalid input(s): POCBNP  CBG:  Recent Labs Lab 02/06/15 1215 02/06/15 1710 02/06/15 2059 02/07/15 0647 02/07/15 1157  GLUCAP 499* 588* 277* 237* 308*    Microbiology: Results for orders placed or performed during the hospital encounter of 07/20/12  Rapid strep screen     Status: None   Collection Time: 07/20/12  9:13 AM  Result Value Ref Range Status   Streptococcus, Group A Screen (Direct) NEGATIVE NEGATIVE Final    Comment:         DUE TO INADEQUATE SENSITIVITY OF EIA RAPID TESTS FOR GROUP A STREP (GAS) IT IS RECOMMENDED THAT ALL NEGATIVE RESULTS BE FOLLOWED BY A GROUP A STREP PROBE.  Strep A DNA probe     Status: None   Collection Time: 07/20/12  9:13 AM  Result Value Ref Range Status   Specimen Description THROAT  Final   Special Requests Normal  Final   Group A Strep Probe NEGATIVE  Final   Report Status 07/21/2012 FINAL  Final    Coagulation Studies: No results for input(s): LABPROT, INR in the last 72 hours.  Urinalysis:  Recent Labs Lab 02/01/15 2227  COLORURINE YELLOW  LABSPEC 1.030  PHURINE 5.0  GLUCOSEU >1000*  HGBUR NEGATIVE  BILIRUBINUR NEGATIVE  KETONESUR 40*  PROTEINUR NEGATIVE  UROBILINOGEN 0.2  NITRITE NEGATIVE  LEUKOCYTESUR NEGATIVE    Lipid Panel:     Component Value Date/Time   CHOL 153 02/06/2015 0613   TRIG 58 02/06/2015 0613   HDL 53 02/06/2015 0613   CHOLHDL 2.9 02/06/2015 0613   VLDL 12 02/06/2015 0613   LDLCALC 88 02/06/2015 0613    HgbA1C:  Lab Results  Component Value Date   HGBA1C 12.9* 02/06/2015    Urine Drug Screen:     Component Value Date/Time   LABOPIA NONE DETECTED 02/01/2015 2227   COCAINSCRNUR NONE DETECTED 02/01/2015 2227   LABBENZ NONE DETECTED 02/01/2015 2227   AMPHETMU NONE DETECTED 02/01/2015 2227   THCU NONE DETECTED 02/01/2015 2227   LABBARB NONE DETECTED 02/01/2015 2227    Alcohol Level:  Recent Labs Lab 02/01/15 2056  ETH <5    Other results: EKG: normal sinus rhythm at 63 bpm.  Imaging: No results found.   Assessment/Plan: 19 year old male with anger outbursts and amnesia related to these events.  Although there have been some report of frontal seizures characterized by anger outbursts these are quite rare and to be associated with the "flashbacks" described by the patient is even more unusual.  Do not suspect that these are epileptic in origin.    Recommendations: 1.  EEG 2. MRI of the brain with and without  contrast 3. If above work up unremarkable, no further work up recommended and agree with continued psychiatric management.  Otherwise patient may follow up with neurology as an outpatient.  If there are any questions in the interim, the [facility] as listed on Amion may be called.     Thana Farr, MD [facility] [phone] 02/07/2015, 2:02 PM

## 2015-02-08 ENCOUNTER — Inpatient Hospital Stay (HOSPITAL_COMMUNITY)
Admission: EM | Admit: 2015-02-08 | Discharge: 2015-02-08 | Disposition: A | Discharge status: Home / Self Care | Payer: Medicaid Other | Source: Intra-hospital | Attending: Neurology | Admitting: Neurology

## 2015-02-08 ENCOUNTER — Ambulatory Visit (HOSPITAL_COMMUNITY): Admit: 2015-02-08 | Payer: Medicaid Other

## 2015-02-08 ENCOUNTER — Ambulatory Visit (HOSPITAL_COMMUNITY)
Admit: 2015-02-08 | Discharge: 2015-02-08 | Disposition: A | Discharge status: Home / Self Care | Payer: Medicaid Other | Source: Ambulatory Visit | Attending: Neurology | Admitting: Neurology

## 2015-02-08 DIAGNOSIS — F6381 Intermittent explosive disorder: Secondary | ICD-10-CM | POA: Diagnosis not present

## 2015-02-08 DIAGNOSIS — R404 Transient alteration of awareness: Secondary | ICD-10-CM

## 2015-02-08 LAB — GLUCOSE, CAPILLARY
Glucose-Capillary: 84 mg/dL (ref 65–99)
Glucose-Capillary: 146 mg/dL — ABNORMAL HIGH (ref 65–99)
Glucose-Capillary: 372 mg/dL — ABNORMAL HIGH (ref 65–99)

## 2015-02-08 MED ORDER — TRAZODONE HCL 50 MG PO TABS: 50.0000 mg | ORAL_TABLET | Freq: Every evening | ORAL | Status: AC | PRN

## 2015-02-08 MED ORDER — DIVALPROEX SODIUM ER 500 MG PO TB24: 500.0000 mg | ORAL_TABLET | Freq: Two times a day (BID) | ORAL | Status: AC

## 2015-02-08 MED ORDER — PAROXETINE HCL 10 MG PO TABS: 10.0000 mg | ORAL_TABLET | Freq: Every day | ORAL | Status: DC

## 2015-02-08 MED ORDER — PAROXETINE HCL 10 MG PO TABS: 10.0000 mg | ORAL_TABLET | Freq: Every day | ORAL | Status: AC

## 2015-02-08 MED ORDER — INSULIN GLARGINE 100 UNIT/ML ~~LOC~~ SOLN: 33.0000 [IU] | Freq: Every day | SUBCUTANEOUS | Status: DC

## 2015-02-08 MED ORDER — HYDROXYZINE HCL 25 MG PO TABS: 25.0000 mg | ORAL_TABLET | Freq: Four times a day (QID) | ORAL | Status: AC | PRN

## 2015-02-08 MED ORDER — LISINOPRIL 10 MG PO TABS: 10.0000 mg | ORAL_TABLET | Freq: Every day | ORAL | Status: DC

## 2015-02-08 MED ORDER — DIVALPROEX SODIUM ER 500 MG PO TB24: 500.0000 mg | ORAL_TABLET | Freq: Two times a day (BID) | ORAL | Status: DC

## 2015-02-08 MED ORDER — LURASIDONE HCL 20 MG PO TABS: 20.0000 mg | ORAL_TABLET | Freq: Every day | ORAL | Status: DC

## 2015-02-08 MED ORDER — TRAZODONE HCL 50 MG PO TABS: 50.0000 mg | ORAL_TABLET | Freq: Every evening | ORAL | Status: DC | PRN

## 2015-02-08 MED ORDER — LURASIDONE HCL 20 MG PO TABS: 20.0000 mg | ORAL_TABLET | Freq: Every day | ORAL | Status: AC

## 2015-02-08 MED ORDER — GADOBENATE DIMEGLUMINE 529 MG/ML IV SOLN
20.0000 mL | Freq: Once | INTRAVENOUS | Status: AC | PRN
  Administered 2015-02-08: 16 mL via INTRAVENOUS

## 2015-02-08 MED ORDER — LEVOTHYROXINE SODIUM 75 MCG PO TABS: 75.0000 ug | ORAL_TABLET | ORAL | Status: AC
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
## 2015-02-08 NOTE — Progress Notes (Signed)
Pt was scheduled for EEG at [facility] this am however MRI was rescheduled from 2:00pm to 9:30 am. EEG will be completed as schedule permits.

## 2015-02-08 NOTE — Progress Notes (Signed)
D: Pt alert and coherent. Visible in milieu at intervals during shift. Pt was transported to [facility] for ordered MRI procedure. EEG was done at bedside at [facility] and pt was cooperative with procedure. Attended scheduled noon group. Pending d/c this evening.  A: Introduced self to pt on initial contact. Medications administered as per MD's orders. Support, availability and encouragement provided to this pt. Q15 minutes checks maintained for safety on and off unit.  R: Pt cooperative with care and unit routines. Remains safe on unit. Aware of scheduled d/c and is in agreement. Continue POC.

## 2015-02-08 NOTE — Procedures (Signed)
ELECTROENCEPHALOGRAM REPORT  Date of Study: 02/08/2015  Patient's Name: [name] MRN: [id number] Date of Birth: 06-22-1996  Referring Provider: Dr. Thana Farr  Clinical History: This is a 19 year old man with outbursts of anger that he is amnestic of.  Medications: divalproex (DEPAKOTE ER) 24 hr tablet 500 mg LORazepam (ATIVAN) tablet 1 mg lurasidone (LATUDA) tablet 20 mg PARoxetine (PAXIL) tablet 10 mg traZODone (DESYREL) tablet 50 mg acetaminophen (TYLENOL) tablet 650 mg alum & mag hydroxide-simeth (MAALOX/MYLANTA) 200-200-20 MG/5ML suspension 30 mL hydrOXYzine (ATARAX/VISTARIL) tablet 25 mg levothyroxine (SYNTHROID, LEVOTHROID) tablet 75 mcg magnesium hydroxide (MILK OF MAGNESIA) suspension 30 mL  Technical Summary: A multichannel digital EEG recording measured by the international 10-20 system with electrodes applied with paste and impedances below 5000 ohms performed in our laboratory with EKG monitoring in an awake and asleep patient.  Hyperventilation was performed. Photic stimulation was not performed.  The digital EEG was referentially recorded, reformatted, and digitally filtered in a variety of bipolar and referential montages for optimal display.    Description: The patient is awake and asleep during the recording.  During maximal wakefulness, there is a symmetric, medium voltage 9.5 Hz posterior dominant rhythm that attenuates with eye opening.  The record is symmetric.  During drowsiness and stage I sleep, there is an increase in theta slowing of the background with occasional vertex waves seen.  Hyperventilation did not elicit any abnormalities.  There were no epileptiform discharges or electrographic seizures seen.    EKG lead was unremarkable.  Impression: This awake and asleep EEG is normal.    Clinical Correlation: A normal EEG does not exclude a clinical diagnosis of epilepsy.  Clinical correlation is advised.   Patrcia Dolly, M.D.

## 2015-02-08 NOTE — Progress Notes (Signed)
Recreation Therapy Notes  Date: 06.24.16 Time: 9:30 am Location: [address] Hall Group Room  Group Topic: Stress Management  Goal Area(s) Addresses:  Patient will verbalize importance of using healthy stress management.  Patient will identify positive emotions associated with healthy stress management.   Intervention: Stress Management  Activity :  Progressive Muscle Relaxation.  LRT introduced and educated patients on the stress management technique of progressive muscle relaxation.  A script was used to deliver the technique to patients.  Patients were asked to follow the script read aloud by LRT to engage in practicing the stress management techniques.  Education:  Stress Management, Discharge Planning.   Education Outcome: Acknowledges edcuation/In group clarification offered/Needs additional education  Clinical Observations/Feedback: Patient did not attend group.   Caroll Rancher, LRT/CTRS         Lillia Abed, Josalyn Dettmann A 02/08/2015 1:26 PM

## 2015-02-08 NOTE — Progress Notes (Signed)
Pt reports he is having a good day.  He denies SI/HI/AVH.  He is glad his CBGs have leveled off to near normal.  It was 125 this evening before karaoke.  He had visitors this evening and said it was a good visit.  He hopes that he can be stabilized on medication that will improve his mood and help him control his emotions better.  He voices no needs or concerns this evening.  He has been playing chess with another peer on the hall after karaoke.  Pt was encouraged to make his needs known to staff.  Support and encouragement offered.  Safety maintained with q15 minute checks.

## 2015-02-08 NOTE — Progress Notes (Signed)
EEG completed; results pending.    

## 2015-02-08 NOTE — Progress Notes (Signed)
D: Client visible in the hall and dayroom, interacts appropriately with staff and peers. Client notes his ready for discharge when his ride arrives. Client denies SHI, reports program was beneficial. A: Writer provided emotional support, reviewed discharged instructions and medications. Staff will monitor q58min for safety. R: Client verbalized understanding discharge instructions. Belongs retrieved from locker. Client is safe on the unit.

## 2015-02-08 NOTE — BHH Suicide Risk Assessment (Signed)
[facility] Discharge Suicide Risk Assessment   Demographic Factors:  18 year old single male, college student, lives with roommates  Total Time spent with patient: 30 minutes  Musculoskeletal: Strength & Muscle Tone: within normal limits Gait & Station: normal Patient leans: N/A  Psychiatric Specialty Exam: Physical Exam  ROS  Blood pressure 112/53, pulse 78, temperature 97.7 F (36.5 C), temperature source Oral, resp. rate 16, height 5\' 9"  (1.753 m), weight 170 lb (77.111 kg).Body mass index is 25.09 kg/(m^2).  General Appearance: Well Groomed  Eye Contact::  Good  Speech:  Normal Rate409  Volume:  Normal  Mood:  Euthymic- at this time denies any depression or sadness   Affect:  Appropriate and Full Range  Thought Process:  Goal Directed and Linear  Orientation:  Full (Time, Place, and Person)  Thought Content:  denies hallucinations, no delusions  Suicidal Thoughts:  No- denies any thoughts of hurting self or killing self , denies any thoughts of HI or hurting anyone   Homicidal Thoughts:  No  Memory:  recent and remote grossly intact   Judgement:  Other:  improved   Insight:  improving   Psychomotor Activity:  Normal  Concentration:  Good  Recall:  Good  Fund of Knowledge:Good  Language: Good  Akathisia:  Negative  Handed:  Right  AIMS (if indicated):     Assets:  Communication Skills Desire for Improvement Resilience Vocational/Educational  Sleep:  Number of Hours: 6  Cognition: WNL  ADL's:  Improved    Have you used any form of tobacco in the last 30 days? (Cigarettes, Smokeless Tobacco, Cigars, and/or Pipes): No  Has this patient used any form of tobacco in the last 30 days? (Cigarettes, Smokeless Tobacco, Cigars, and/or Pipes) No  Mental Status Per Nursing Assessment::   On Admission:  NA  Current Mental Status by Physician: At this time patient is fully alert and attentive, calm, well groomed, mood is improved and euthymic at this time, denies depression,  affect is appropriate, brighter , and not irritable or angry, no thought disorder, no SI or HI, no violent ideations towards anyone , no hallucinations, no delusions, future oriented, and oriented x 3.   Loss Factors: Episodes of anger, blackouts .   Historical Factors: History of PTSD, History of  Episodes described as angry outbursts /blackouts, history of cutting, burning in the past . Denies any history of violence towards persons, other than " the typical teenage fights in highschool."  Denies any access to firearms/weapons .   Risk Reduction Factors:   Sense of responsibility to family, Living with another person, especially a relative, Positive social support and Positive coping skills or problem solving skills  Continued Clinical Symptoms:  At this time patient is improved compared to admission- as noted, he has had no episodes of anger or irritability or explosiveness in hospital, and there have been no blackouts. States medications are working well. At this time he is calm, euthymic, and looking forward to discharge. As per Neurology consult, it is very unlikely these episodes are related to seizure disorder- Head MRI was ordered and was negative. EEG ordered and is currently pending. Patient states he is feeling better and " I am ready to go home". He states he will follow up with Neurologist as outpatient if needed .  Cognitive Features That Contribute To Risk:  No gross cognitive deficits noted upon discharge. Is alert , attentive, and oriented x 3   Suicide Risk:  Mild:  Suicidal ideation of limited  frequency, intensity, duration, and specificity.  There are no identifiable plans, no associated intent, mild dysphoria and related symptoms, good self-control (both objective and subjective assessment), few other risk factors, and identifiable protective factors, including available and accessible social support.  Principal Problem: Bipolar 1 disorder, manic, moderate Discharge  Diagnoses:  Patient Active Problem List   Diagnosis Date Noted  . Type 1 diabetes mellitus, uncontrolled [E10.65] 02/06/2015  . Hyperglycemia due to type 1 diabetes mellitus [E10.65] 02/06/2015  . Hyponatremia [E87.1] 02/06/2015  . Bipolar 1 disorder, manic, moderate [F31.12] 02/05/2015  . Intermittent explosive disorder [F63.81]   . Bipolar affective disorder, current episode depressed [F31.30] 02/03/2015  . Aggressive behavior [F60.89] 02/02/2015  . Suicidal ideation [R45.851] 02/02/2015  . Hyperglycemia [R73.9]   . Suicidal ideations [R45.851]   . Non compliance with medical treatment [Z91.19] 07/11/2013  . 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 Periapical abscess [K04.7] 10/24/2011  . Facial cellulitis [L03.211] 10/24/2011  . Type 1 diabetes mellitus not at goal [E10.9]   . Goiter [E04.9]   . Hypoglycemia associated with diabetes [E11.649]   . 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 Thyroiditis, autoimmune [E06.3]   . Autonomic neuropathy due to diabetes [E11.43]   . Tachycardia [R00.0]   . 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 DKA (diabetic ketoacidoses) [E13.10] 06/29/2011  . Pneumothorax [512] 06/28/2011    Class: Acute  . Dehydration, moderate [E86.0] 06/28/2011    Class: Acute  . HYPERTENSION NEC [T81.89XA, I15.8] 04/08/2010    Follow-up Information    Follow up with Sherre Scarlet - [facility]  On 02/12/2015.   Why:  You are scheduled with Sherre Scarlet on Tuesday, February 12, 2015 at 3 PM. Please arrive 15 minutes early to complete registration   Contact information:   [address] [place], [place]   [address]  [phone]      Plan Of Care/Follow-up recommendations:  Activity:  as tolerated Diet:  Diabetic Diet  Tests:  NA Other:  see below  Is patient on multiple antipsychotic therapies at discharge:  No   Has Patient had three or more failed trials of antipsychotic monotherapy by history:  No  Recommended Plan for Multiple  Antipsychotic Therapies: NA  Patient is requesting discharge and there are no grounds for involuntary commitment. He is leaving unit in good spirits. Plans to follow up as above. Plans to follow up at [facility] . Has an established PCP for management of Medical issues /diabetes- Dr. Fransico Michael. Encouraged to follow up with Outpatient Neurology to follow up / consider getting EEG- see above .   [name] 02/08/2015, 1:00 PM

## 2015-02-08 NOTE — Progress Notes (Signed)
  [facility] Adult Case Management Discharge Plan :  Will you be returning to the same living situation after discharge:  Yes,  Patient is returning home with family. At discharge, do you have transportation home?: Yes,  Patient is arranging transportation home. Do you have the ability to pay for your medications: No.  Patient needs assistance with indigent medications   Release of information consent forms completed and in the chart;  Patient's signature needed at discharge.  Patient to Follow up at: Follow-up Information    Follow up with Sherre Scarlet - [facility]  On 02/12/2015.   Why:  You are scheduled with Sherre Scarlet on Tuesday, February 12, 2015 at 3 PM. Please arrive 15 minutes early to complete registration   Contact information:   [address] [place], [place]   [address]  [phone]      Follow up with [facility] On 04/01/2015.   Why:  You have been scheduled to see Dr Karel Jarvis on 8/15.  Please arrive 8:30 am   Contact information:   [address] [place] [address] [phone]      Patient denies SI/HI: Patient no longer endorsing SI/HI or other thoughts of self harm.    .Reviewed with all patients during discharge planning group  Have you used any form of tobacco in the last 30 days? (Cigarettes, Smokeless Tobacco, Cigars, and/or Pipes): No  Has patient been referred to the Quitline?:  No referral needed.  Wynn Banker 02/08/2015, 3:09 PM

## 2015-02-08 NOTE — BHH Group Notes (Signed)
[facility] LCSW Aftercare Discharge Planning Group Note   02/08/2015 12:33 PM  Participation Quality:  Patient did not attend group.  Barron Vanloan, Joesph July

## 2015-02-08 NOTE — BHH Group Notes (Signed)
[facility] LCSW Group Therapy  02/08/2015 3:09 PM  Type of Therapy:  Group Therapy  Participation Level:  Did Not Attend   Wynn Banker 02/08/2015, 3:09 PM

## 2015-02-08 NOTE — Tx Team (Signed)
Interdisciplinary Treatment Plan Update (Adult)  Date:  02/08/2015  Time Reviewed:  8:46 AM   Progress in Treatment: Attending groups: Patient is attending groups. Participating in groups:  Patient engages in discussion Taking medication as prescribed:  Patient is taking medications Tolerating medication:  Patient is tolerating medications Family/Significant othe contact made:   Yes, collateral contact with aunt. Patient understands diagnosis:Yes, patient understands diagnosis and need for treatment Discussing patient identified problems/goals with staff:  Yes, patient is able to express goals/problems Medical problems stabilized or resolved:  Yes Denies suicidal/homicidal ideation: Yes, patient is denying SI/HI. Issues/concerns per patient self-inventory:   Other:  Discharge Plan or Barriers:  [facility]  Reason for Continuation of Hospitalization: Anxiety Depression Medication stabilization   Comments:   Continue medication stabilization  Additional comments:  Patient and CSW reviewed Patient Discharge Process Letter/Patient Involvement Form.  Patient verbalized understanding and signed form.  Patient and CSW also reviewed and identified patient's goals and treatment plan.  Patient verbalized understanding and agreed to plan.  Estimated length of stay:  1-3 days  New goal(s):  Review of initial/current patient goals per problem list:  Please see plan of careInterdisciplinary Treatment Plan Update (Adult)  Attendees: Patient 02/08/2015 8:46 AM   Family:   02/08/2015 8:46 AM   Physician:  Nehemiah Massed, MD 02/08/2015 8:46 AM   Nursing:  Rayetta Humphrey, RN 02/08/2015 8:46 AM   Clinical Social Worker:  Juline Patch, LCSW 02/08/2015 8:46 AM   Clinical Social Worker:  Samuella Bruin, LCSW-A 02/08/2015 8:46 AM   Case Manager:  Onnie Boer, RN 02/08/2015 8:46 AM   Other:  Lendell Caprice, RN 02/08/2015 8:46 AM  Other:   02/08/2015  8:46 AM   Other:  02/08/2015 8:46 AM    Other:  02/08/2015 8:46 AM   Other:  02/08/2015 8:46 AM   Other:  Chad Cordial Transition Team Coordinator 02/08/2015 8:46 AM   Other:   02/08/2015 8:46 AM   Other:  02/08/2015 8:46 AM   Other:   02/08/2015 8:46 AM    Scribe for Treatment Team:   Wynn Banker, 02/08/2015   8:46 AM

## 2015-02-11 ENCOUNTER — Other Ambulatory Visit: Payer: Self-pay

## 2015-02-14 ENCOUNTER — Other Ambulatory Visit: Payer: Self-pay

## 2015-02-19 ENCOUNTER — Other Ambulatory Visit: Payer: Self-pay | Admitting: *Deleted

## 2015-02-19 DIAGNOSIS — E1065 Type 1 diabetes mellitus with hyperglycemia: Secondary | ICD-10-CM

## 2015-02-19 DIAGNOSIS — IMO0002 Reserved for concepts with insufficient information to code with codable children: Secondary | ICD-10-CM

## 2015-04-01 ENCOUNTER — Ambulatory Visit: Payer: Self-pay | Admitting: Neurology

## 2015-04-04 ENCOUNTER — Encounter: Payer: Self-pay | Admitting: *Deleted

## 2015-04-04 NOTE — Progress Notes (Signed)
No show letter sent for missed appointment on 04/01/2015 

## 2015-06-03 ENCOUNTER — Other Ambulatory Visit: Payer: Self-pay | Admitting: "Endocrinology

## 2015-08-23 IMAGING — CR DG CHEST 1V
1 series · 1 of 1 positions shown · non-contrast
Comparison: Chest x-ray from yesterday

CLINICAL DATA: Abnormal chest x-ray.

EXAM:
CHEST - 1 VIEW

[w chest pa]
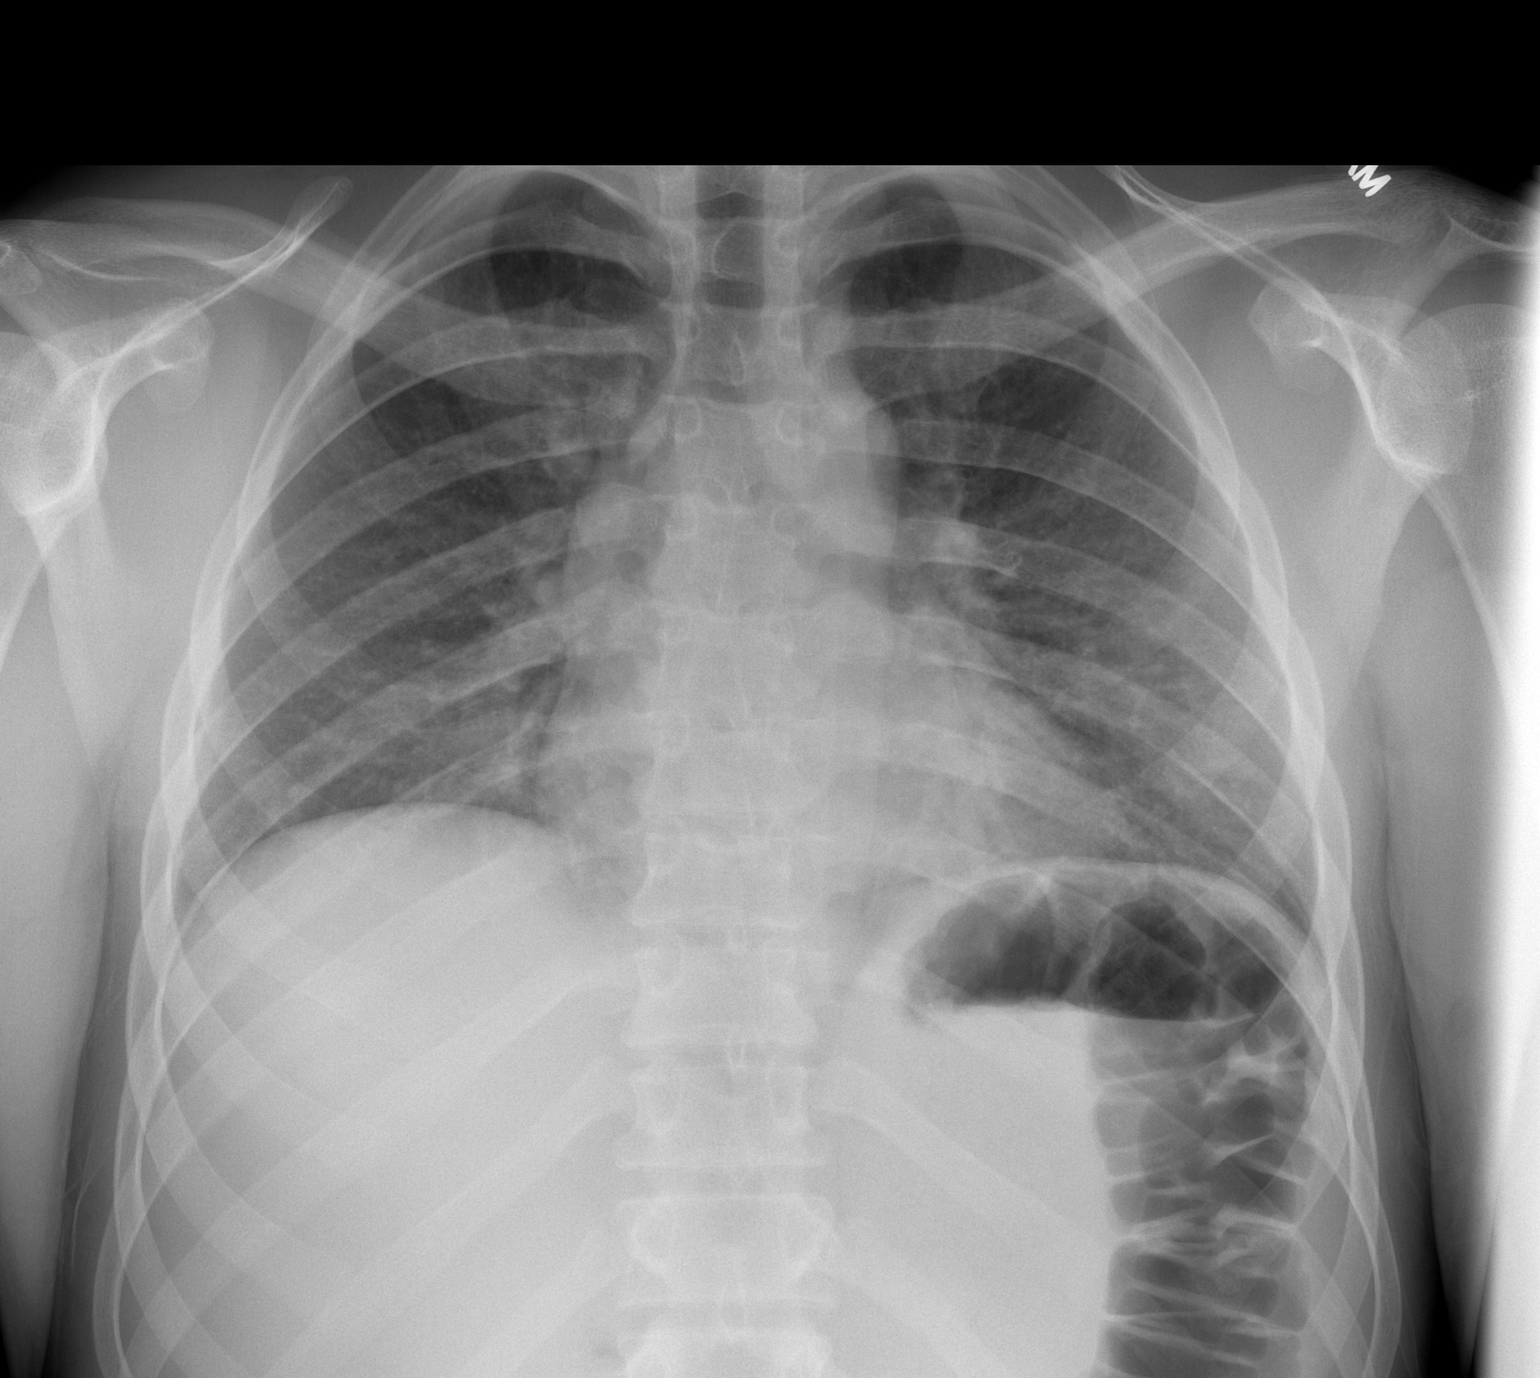

[1 of 1 positions shown; findings below may reference images not displayed]

FINDINGS: Unexplained thin density in the peripheral left mid chest persists.
Fortunately, this does not represent a pneumothorax. Expected
increased density of the lungs related to expiration. Normal heart
size. Negative osseous structures.
IMPRESSION: Negative for pneumothorax.

## 2016-09-14 ENCOUNTER — Encounter (HOSPITAL_COMMUNITY): Payer: Self-pay

## 2016-09-14 ENCOUNTER — Inpatient Hospital Stay (HOSPITAL_COMMUNITY)
Admission: EM | Admit: 2016-09-14 | Discharge: 2016-09-16 | DRG: 638 | Disposition: A | Discharge status: Home / Self Care | Payer: Medicaid Other | Attending: Internal Medicine | Admitting: Internal Medicine

## 2016-09-14 DIAGNOSIS — D72829 Elevated white blood cell count, unspecified: Secondary | ICD-10-CM | POA: Diagnosis present

## 2016-09-14 DIAGNOSIS — E860 Dehydration: Secondary | ICD-10-CM | POA: Diagnosis present

## 2016-09-14 DIAGNOSIS — M549 Dorsalgia, unspecified: Secondary | ICD-10-CM | POA: Diagnosis present

## 2016-09-14 DIAGNOSIS — R6510 Systemic inflammatory response syndrome (SIRS) of non-infectious origin without acute organ dysfunction: Secondary | ICD-10-CM | POA: Diagnosis present

## 2016-09-14 DIAGNOSIS — Z23 Encounter for immunization: Secondary | ICD-10-CM

## 2016-09-14 DIAGNOSIS — F3112 Bipolar disorder, current episode manic without psychotic features, moderate: Secondary | ICD-10-CM | POA: Diagnosis present

## 2016-09-14 DIAGNOSIS — Z823 Family history of stroke: Secondary | ICD-10-CM

## 2016-09-14 DIAGNOSIS — E1110 Type 2 diabetes mellitus with ketoacidosis without coma: Secondary | ICD-10-CM | POA: Diagnosis present

## 2016-09-14 DIAGNOSIS — T383X6A Underdosing of insulin and oral hypoglycemic [antidiabetic] drugs, initial encounter: Secondary | ICD-10-CM | POA: Diagnosis present

## 2016-09-14 DIAGNOSIS — Z833 Family history of diabetes mellitus: Secondary | ICD-10-CM

## 2016-09-14 DIAGNOSIS — N179 Acute kidney failure, unspecified: Secondary | ICD-10-CM | POA: Diagnosis present

## 2016-09-14 DIAGNOSIS — Y92009 Unspecified place in unspecified non-institutional (private) residence as the place of occurrence of the external cause: Secondary | ICD-10-CM

## 2016-09-14 DIAGNOSIS — IMO0002 Reserved for concepts with insufficient information to code with codable children: Secondary | ICD-10-CM | POA: Diagnosis present

## 2016-09-14 DIAGNOSIS — Z91120 Patient's intentional underdosing of medication regimen due to financial hardship: Secondary | ICD-10-CM

## 2016-09-14 DIAGNOSIS — Z825 Family history of asthma and other chronic lower respiratory diseases: Secondary | ICD-10-CM

## 2016-09-14 DIAGNOSIS — E1010 Type 1 diabetes mellitus with ketoacidosis without coma: Principal | ICD-10-CM

## 2016-09-14 DIAGNOSIS — E039 Hypothyroidism, unspecified: Secondary | ICD-10-CM | POA: Diagnosis present

## 2016-09-14 DIAGNOSIS — Z794 Long term (current) use of insulin: Secondary | ICD-10-CM

## 2016-09-14 DIAGNOSIS — E1065 Type 1 diabetes mellitus with hyperglycemia: Secondary | ICD-10-CM | POA: Diagnosis present

## 2016-09-14 DIAGNOSIS — I10 Essential (primary) hypertension: Secondary | ICD-10-CM | POA: Diagnosis present

## 2016-09-14 DIAGNOSIS — E10618 Type 1 diabetes mellitus with other diabetic arthropathy: Secondary | ICD-10-CM | POA: Diagnosis present

## 2016-09-14 DIAGNOSIS — E063 Autoimmune thyroiditis: Secondary | ICD-10-CM | POA: Diagnosis present

## 2016-09-14 HISTORY — DX: Bipolar disorder, unspecified: F31.9

## 2016-09-14 LAB — I-STAT CHEM 8, ED
BUN: 18 mg/dL (ref 6–20)
CREATININE: 1.6 mg/dL — AB (ref 0.61–1.24)
Calcium, Ion: 1.17 mmol/L (ref 1.15–1.40)
Chloride: 104 mmol/L (ref 101–111)
Glucose, Bld: 381 mg/dL — ABNORMAL HIGH (ref 65–99)
HEMATOCRIT: 57 % — AB (ref 39.0–52.0)
Hemoglobin: 19.4 g/dL — ABNORMAL HIGH (ref 13.0–17.0)
POTASSIUM: 4.8 mmol/L (ref 3.5–5.1)
Sodium: 137 mmol/L (ref 135–145)
TCO2: 9 mmol/L (ref 0–100)

## 2016-09-14 LAB — BASIC METABOLIC PANEL
Anion gap: 31 — ABNORMAL HIGH (ref 5–15)
BUN: 14 mg/dL (ref 6–20)
CHLORIDE: 99 mmol/L — AB (ref 101–111)
CO2: 6 mmol/L — ABNORMAL LOW (ref 22–32)
Calcium: 9 mg/dL (ref 8.9–10.3)
Creatinine, Ser: 2.04 mg/dL — ABNORMAL HIGH (ref 0.61–1.24)
GFR calc Af Amer: 52 mL/min — ABNORMAL LOW (ref >60)
GFR calc non Af Amer: 45 mL/min — ABNORMAL LOW (ref >60)
Glucose, Bld: 344 mg/dL — ABNORMAL HIGH (ref 65–99)
POTASSIUM: 4.4 mmol/L (ref 3.5–5.1)
Sodium: 136 mmol/L (ref 135–145)

## 2016-09-14 LAB — CBC
HCT: 52.3 % — ABNORMAL HIGH (ref 39.0–52.0)
Hemoglobin: 18.5 g/dL — ABNORMAL HIGH (ref 13.0–17.0)
MCH: 30.1 pg (ref 26.0–34.0)
MCHC: 35.4 g/dL (ref 30.0–36.0)
MCV: 85.2 fL (ref 78.0–100.0)
Platelets: 331 10*3/uL (ref 150–400)
RBC: 6.14 MIL/uL — AB (ref 4.22–5.81)
RDW: 13.1 % (ref 11.5–15.5)
WBC: 16.2 10*3/uL — ABNORMAL HIGH (ref 4.0–10.5)

## 2016-09-14 LAB — I-STAT CG4 LACTIC ACID, ED: Lactic Acid, Venous: 3.25 mmol/L (ref 0.5–1.9)

## 2016-09-14 LAB — I-STAT VENOUS BLOOD GAS, ED
Acid-base deficit: 24 mmol/L — ABNORMAL HIGH (ref 0.0–2.0)
Bicarbonate: 6.1 mmol/L — ABNORMAL LOW (ref 20.0–28.0)
O2 Saturation: 40 %
PCO2 VEN: 23.9 mmHg — AB (ref 44.0–60.0)
PH VEN: 7.012 — AB (ref 7.250–7.430)
TCO2: 7 mmol/L (ref 0–100)
pO2, Ven: 33 mmHg (ref 32.0–45.0)

## 2016-09-14 LAB — CBG MONITORING, ED: Glucose-Capillary: 356 mg/dL — ABNORMAL HIGH (ref 65–99)

## 2016-09-14 MED ORDER — SODIUM CHLORIDE 0.9 % IV SOLN
INTRAVENOUS | Status: DC
  Administered 2016-09-14: 2.8 [IU]/h via INTRAVENOUS
  Filled 2016-09-14: qty 2.5

## 2016-09-14 MED ORDER — SODIUM CHLORIDE 0.9 % IV SOLN
INTRAVENOUS | Status: DC
  Administered 2016-09-14: via INTRAVENOUS

## 2016-09-14 MED ORDER — DEXTROSE-NACL 5-0.45 % IV SOLN: INTRAVENOUS | Status: DC

## 2016-09-14 MED ORDER — SODIUM CHLORIDE 0.9 % IV BOLUS (SEPSIS)
1000.0000 mL | Freq: Once | INTRAVENOUS | Status: AC
  Administered 2016-09-14: 1000 mL via INTRAVENOUS

## 2016-09-14 MED ORDER — MORPHINE SULFATE (PF) 4 MG/ML IV SOLN
4.0000 mg | Freq: Once | INTRAVENOUS | Status: AC
Start: 2016-09-14 — End: 2016-09-14
  Administered 2016-09-14: 4 mg via INTRAVENOUS
  Filled 2016-09-14: qty 1

## 2016-09-14 MED ORDER — ONDANSETRON HCL 4 MG/2ML IJ SOLN
4.0000 mg | Freq: Once | INTRAMUSCULAR | Status: AC
  Administered 2016-09-14: 4 mg via INTRAVENOUS
  Filled 2016-09-14: qty 2

## 2016-09-14 NOTE — ED Triage Notes (Signed)
Pt states is a type I diabetic. Pt states has not taken insulin as RX recently. Pt complaining of nausea and vomiting. Pt complaining of dizziness. Pt CBG = 356 at triage. Pt tachypnic at triage.

## 2016-09-15 ENCOUNTER — Inpatient Hospital Stay (HOSPITAL_COMMUNITY): Payer: Self-pay

## 2016-09-15 ENCOUNTER — Encounter (HOSPITAL_COMMUNITY): Payer: Self-pay | Admitting: Family Medicine

## 2016-09-15 DIAGNOSIS — E038 Other specified hypothyroidism: Secondary | ICD-10-CM

## 2016-09-15 DIAGNOSIS — N179 Acute kidney failure, unspecified: Secondary | ICD-10-CM | POA: Diagnosis present

## 2016-09-15 DIAGNOSIS — F3112 Bipolar disorder, current episode manic without psychotic features, moderate: Secondary | ICD-10-CM

## 2016-09-15 DIAGNOSIS — E1010 Type 1 diabetes mellitus with ketoacidosis without coma: Secondary | ICD-10-CM

## 2016-09-15 DIAGNOSIS — D72829 Elevated white blood cell count, unspecified: Secondary | ICD-10-CM

## 2016-09-15 LAB — CREATININE, URINE, RANDOM: CREATININE, URINE: 76.07 mg/dL

## 2016-09-15 LAB — CBG MONITORING, ED
GLUCOSE-CAPILLARY: 149 mg/dL — AB (ref 65–99)
GLUCOSE-CAPILLARY: 109 mg/dL — AB (ref 65–99)
GLUCOSE-CAPILLARY: 124 mg/dL — AB (ref 65–99)
GLUCOSE-CAPILLARY: 155 mg/dL — AB (ref 65–99)
Glucose-Capillary: 224 mg/dL — ABNORMAL HIGH (ref 65–99)
Glucose-Capillary: 268 mg/dL — ABNORMAL HIGH (ref 65–99)

## 2016-09-15 LAB — URINALYSIS, ROUTINE W REFLEX MICROSCOPIC
BILIRUBIN URINE: NEGATIVE
Glucose, UA: >=500 mg/dL — AB
Ketones, ur: 80 mg/dL — AB
LEUKOCYTES UA: NEGATIVE
Nitrite: NEGATIVE
PH: 5 (ref 5.0–8.0)
Protein, ur: 100 mg/dL — AB
Specific Gravity, Urine: 1.018 (ref 1.005–1.030)

## 2016-09-15 LAB — GLUCOSE, CAPILLARY
GLUCOSE-CAPILLARY: 158 mg/dL — AB (ref 65–99)
GLUCOSE-CAPILLARY: 157 mg/dL — AB (ref 65–99)
GLUCOSE-CAPILLARY: 180 mg/dL — AB (ref 65–99)
Glucose-Capillary: 151 mg/dL — ABNORMAL HIGH (ref 65–99)
Glucose-Capillary: 166 mg/dL — ABNORMAL HIGH (ref 65–99)
Glucose-Capillary: 181 mg/dL — ABNORMAL HIGH (ref 65–99)
Glucose-Capillary: 157 mg/dL — ABNORMAL HIGH (ref 65–99)
Glucose-Capillary: 166 mg/dL — ABNORMAL HIGH (ref 65–99)
Glucose-Capillary: 144 mg/dL — ABNORMAL HIGH (ref 65–99)

## 2016-09-15 LAB — BASIC METABOLIC PANEL
ANION GAP: 17 — AB (ref 5–15)
ANION GAP: 12 (ref 5–15)
ANION GAP: 8 (ref 5–15)
Anion gap: 19 — ABNORMAL HIGH (ref 5–15)
BUN: 12 mg/dL (ref 6–20)
BUN: 11 mg/dL (ref 6–20)
BUN: 8 mg/dL (ref 6–20)
BUN: 8 mg/dL (ref 6–20)
CALCIUM: 7.6 mg/dL — AB (ref 8.9–10.3)
CALCIUM: 8.2 mg/dL — AB (ref 8.9–10.3)
CHLORIDE: 109 mmol/L (ref 101–111)
CHLORIDE: 109 mmol/L (ref 101–111)
CO2: 9 mmol/L — ABNORMAL LOW (ref 22–32)
CO2: 12 mmol/L — AB (ref 22–32)
CO2: 15 mmol/L — ABNORMAL LOW (ref 22–32)
CO2: 20 mmol/L — ABNORMAL LOW (ref 22–32)
CREATININE: 1.83 mg/dL — AB (ref 0.61–1.24)
Calcium: 8.1 mg/dL — ABNORMAL LOW (ref 8.9–10.3)
Calcium: 8.7 mg/dL — ABNORMAL LOW (ref 8.9–10.3)
Chloride: 108 mmol/L (ref 101–111)
Chloride: 110 mmol/L (ref 101–111)
Creatinine, Ser: 1.54 mg/dL — ABNORMAL HIGH (ref 0.61–1.24)
Creatinine, Ser: 1.35 mg/dL — ABNORMAL HIGH (ref 0.61–1.24)
Creatinine, Ser: 1.16 mg/dL (ref 0.61–1.24)
GFR calc Af Amer: 59 mL/min — ABNORMAL LOW (ref >60)
GFR calc Af Amer: >60 mL/min (ref >60)
GFR calc Af Amer: >60 mL/min (ref >60)
GFR calc Af Amer: >60 mL/min (ref >60)
GFR calc non Af Amer: >60 mL/min (ref >60)
GFR calc non Af Amer: >60 mL/min (ref >60)
GFR calc non Af Amer: >60 mL/min (ref >60)
GFR, EST NON AFRICAN AMERICAN: 51 mL/min — AB (ref >60)
GLUCOSE: 181 mg/dL — AB (ref 65–99)
GLUCOSE: 165 mg/dL — AB (ref 65–99)
GLUCOSE: 147 mg/dL — AB (ref 65–99)
Glucose, Bld: 155 mg/dL — ABNORMAL HIGH (ref 65–99)
POTASSIUM: 3.7 mmol/L (ref 3.5–5.1)
POTASSIUM: 3.6 mmol/L (ref 3.5–5.1)
Potassium: 4.5 mmol/L (ref 3.5–5.1)
Potassium: 4.2 mmol/L (ref 3.5–5.1)
SODIUM: 136 mmol/L (ref 135–145)
Sodium: 138 mmol/L (ref 135–145)
Sodium: 136 mmol/L (ref 135–145)
Sodium: 138 mmol/L (ref 135–145)

## 2016-09-15 LAB — MRSA PCR SCREENING: MRSA by PCR: NEGATIVE

## 2016-09-15 LAB — SODIUM, URINE, RANDOM: SODIUM UR: 47 mmol/L

## 2016-09-15 LAB — CBC
HEMATOCRIT: 44.2 % (ref 39.0–52.0)
HEMOGLOBIN: 15.5 g/dL (ref 13.0–17.0)
MCH: 29.9 pg (ref 26.0–34.0)
MCHC: 35.1 g/dL (ref 30.0–36.0)
MCV: 85.2 fL (ref 78.0–100.0)
Platelets: 241 10*3/uL (ref 150–400)
RBC: 5.19 MIL/uL (ref 4.22–5.81)
RDW: 12.7 % (ref 11.5–15.5)
WBC: 21.4 10*3/uL — AB (ref 4.0–10.5)

## 2016-09-15 LAB — MAGNESIUM: MAGNESIUM: 1.7 mg/dL (ref 1.7–2.4)

## 2016-09-15 LAB — LACTIC ACID, PLASMA
LACTIC ACID, VENOUS: 1.1 mmol/L (ref 0.5–1.9)
Lactic Acid, Venous: 0.6 mmol/L (ref 0.5–1.9)

## 2016-09-15 LAB — PHOSPHORUS: PHOSPHORUS: 3.2 mg/dL (ref 2.5–4.6)

## 2016-09-15 MED ORDER — INSULIN ASPART 100 UNIT/ML ~~LOC~~ SOLN
0.0000 [IU] | Freq: Three times a day (TID) | SUBCUTANEOUS | Status: DC
  Administered 2016-09-15: 3 [IU] via SUBCUTANEOUS

## 2016-09-15 MED ORDER — SODIUM CHLORIDE 0.9 % IV SOLN
INTRAVENOUS | Status: DC
  Administered 2016-09-15: 0.5 [IU]/h via INTRAVENOUS
  Administered 2016-09-15: 3.3 [IU]/h via INTRAVENOUS
  Filled 2016-09-15: qty 2.5

## 2016-09-15 MED ORDER — DIVALPROEX SODIUM ER 500 MG PO TB24
500.0000 mg | ORAL_TABLET | Freq: Two times a day (BID) | ORAL | Status: DC
  Administered 2016-09-15 – 2016-09-16 (×3): 500 mg via ORAL
  Filled 2016-09-15 (×4): qty 1

## 2016-09-15 MED ORDER — ONDANSETRON HCL 4 MG/2ML IJ SOLN: 4.0000 mg | Freq: Four times a day (QID) | INTRAMUSCULAR | Status: DC | PRN

## 2016-09-15 MED ORDER — POTASSIUM CHLORIDE CRYS ER 20 MEQ PO TBCR
20.0000 meq | EXTENDED_RELEASE_TABLET | Freq: Once | ORAL | Status: AC
  Administered 2016-09-15: 20 meq via ORAL
  Filled 2016-09-15: qty 1

## 2016-09-15 MED ORDER — SODIUM CHLORIDE 0.9 % IV SOLN
INTRAVENOUS | Status: DC
  Administered 2016-09-15: 03:00:00 via INTRAVENOUS

## 2016-09-15 MED ORDER — INFLUENZA VAC SPLIT QUAD 0.5 ML IM SUSY
0.5000 mL | PREFILLED_SYRINGE | INTRAMUSCULAR | Status: AC
  Administered 2016-09-16: 0.5 mL via INTRAMUSCULAR
  Filled 2016-09-15: qty 0.5

## 2016-09-15 MED ORDER — TRAZODONE HCL 50 MG PO TABS: 50.0000 mg | ORAL_TABLET | Freq: Every evening | ORAL | Status: DC | PRN

## 2016-09-15 MED ORDER — INSULIN GLARGINE 100 UNIT/ML ~~LOC~~ SOLN
30.0000 [IU] | Freq: Every day | SUBCUTANEOUS | Status: DC
  Administered 2016-09-15 – 2016-09-16 (×2): 30 [IU] via SUBCUTANEOUS
  Filled 2016-09-15 (×2): qty 0.3

## 2016-09-15 MED ORDER — ACETAMINOPHEN 325 MG PO TABS: 650.0000 mg | ORAL_TABLET | Freq: Four times a day (QID) | ORAL | Status: DC | PRN

## 2016-09-15 MED ORDER — ONDANSETRON HCL 4 MG PO TABS: 4.0000 mg | ORAL_TABLET | Freq: Four times a day (QID) | ORAL | Status: DC | PRN

## 2016-09-15 MED ORDER — ENOXAPARIN SODIUM 40 MG/0.4ML ~~LOC~~ SOLN
40.0000 mg | SUBCUTANEOUS | Status: DC
  Administered 2016-09-15 – 2016-09-16 (×2): 40 mg via SUBCUTANEOUS
  Filled 2016-09-15 (×3): qty 0.4

## 2016-09-15 MED ORDER — MORPHINE SULFATE (PF) 4 MG/ML IV SOLN
4.0000 mg | Freq: Once | INTRAVENOUS | Status: AC
  Administered 2016-09-15: 4 mg via INTRAVENOUS
  Filled 2016-09-15: qty 1

## 2016-09-15 MED ORDER — PAROXETINE HCL 10 MG PO TABS
10.0000 mg | ORAL_TABLET | Freq: Every day | ORAL | Status: DC
  Administered 2016-09-15: 10 mg via ORAL
  Filled 2016-09-15: qty 1

## 2016-09-15 MED ORDER — DEXTROSE-NACL 5-0.45 % IV SOLN
INTRAVENOUS | Status: DC
  Administered 2016-09-15: 04:00:00 via INTRAVENOUS

## 2016-09-15 MED ORDER — VILAZODONE HCL 40 MG PO TABS
40.0000 mg | ORAL_TABLET | Freq: Every day | ORAL | Status: DC
  Administered 2016-09-15 – 2016-09-16 (×2): 40 mg via ORAL
  Filled 2016-09-15 (×3): qty 1

## 2016-09-15 MED ORDER — LURASIDONE HCL 20 MG PO TABS
20.0000 mg | ORAL_TABLET | Freq: Every day | ORAL | Status: DC
  Administered 2016-09-15 – 2016-09-16 (×2): 20 mg via ORAL
  Filled 2016-09-15 (×3): qty 1

## 2016-09-15 MED ORDER — ACETAMINOPHEN 650 MG RE SUPP: 650.0000 mg | Freq: Four times a day (QID) | RECTAL | Status: DC | PRN

## 2016-09-15 MED ORDER — SODIUM CHLORIDE 0.9% FLUSH
3.0000 mL | Freq: Two times a day (BID) | INTRAVENOUS | Status: DC
  Administered 2016-09-15 (×2): 3 mL via INTRAVENOUS

## 2016-09-15 MED ORDER — INSULIN ASPART 100 UNIT/ML ~~LOC~~ SOLN: 0.0000 [IU] | Freq: Every day | SUBCUTANEOUS | Status: DC

## 2016-09-15 MED ORDER — LEVOTHYROXINE SODIUM 75 MCG PO TABS
75.0000 ug | ORAL_TABLET | ORAL | Status: DC
  Administered 2016-09-15: 75 ug via ORAL
  Filled 2016-09-15 (×3): qty 1

## 2016-09-15 MED ORDER — SODIUM CHLORIDE 0.9 % IV SOLN
INTRAVENOUS | Status: AC
  Administered 2016-09-15: 02:00:00 via INTRAVENOUS

## 2016-09-15 NOTE — H&P (Signed)
History and Physical  Patient Name: [name]     VWU:[id number]    DOB: 1996/04/15    DOA: 09/14/2016 PCP: PROVIDER NOT IN SYSTEM   Patient coming from: Home  Chief Complaint: Vomiting  HPI: [name] is a 20 y.o. male with a past medical history significant for T1DM, Bipolar 1 and hypothyroidism who presents with 1 day vomiting and malaise.  The patient has been running out of insulin recently and test strips and so he is taking his sugar only once a day and stretching out his insulin.  He was noticing BGs 250-350s here recently and then today woke with nausea, recurrent NBNB emesis and abdominal discomfort.  The vomiting persisted all day until he was just "dry heaving acid" and had chest pain and right low back pain and felt weak and so he came to the ER.  ED course: -Afebrile, heart rate 123, respirations 24 and pulse ox normal, BP 87/50 initially -Na 136, K 4.4, Cr 2.04 (baseline 0.89), WBC 16.2K, Hgb 18 -UA showed glucose, ketones and no pyuria -Lactic acid 3.25 -VBG showed pH acidotic -He was given 2L NS and started on insulin and TRH were asked to evaluate for DKA         ROS: Review of Systems  Constitutional: Negative for chills and fever.  Respiratory: Negative for cough, sputum production and shortness of breath.   Gastrointestinal: Positive for abdominal pain, nausea and vomiting.  Genitourinary: Negative for dysuria, frequency and urgency.  Musculoskeletal: Positive for back pain (right paraspinal). Negative for myalgias.  Psychiatric/Behavioral: Negative for substance abuse (denies IVDU).  All other systems reviewed and are negative.         Past Medical History:  Diagnosis Date  . ADHD (attention deficit hyperactivity disorder)   . Diabetes mellitus    Dec 2007  . Diabetic arthropathy (HCC)   . Goiter   . Hypertension   . Hypertension   . Hypoglycemia associated with diabetes (HCC)   . Hypothyroidism   . Mononucleosis   . Physical  growth delay   . Tachycardia   . Thyroiditis, autoimmune   . Type 1 diabetes mellitus not at goal [facility])   . Vision abnormalities glasses    Past Surgical History:  Procedure Laterality Date  . TONSILLECTOMY  at young age    Social History: Patient lives with his girlfriend and two friends.  The patient walks unassisted.  He used to work in [employer] and in [employer].  he does not smoke.    No Known Allergies  Family history: family history includes COPD in his father; Diabetes in his maternal grandmother; Stroke in his mother.  Prior to Admission medications   Medication Sig Start Date End Date Taking? Authorizing Provider  divalproex (DEPAKOTE ER) 500 MG 24 hr tablet Take 1 tablet (500 mg total) by mouth 2 (two) times daily. 02/08/15  Yes Shuvon B Rankin, NP  hydrOXYzine (ATARAX/VISTARIL) 25 MG tablet Take 1 tablet (25 mg total) by mouth every 6 (six) hours as needed for anxiety. 02/08/15  Yes Adonis Brook, NP  insulin NPH Human (HUMULIN N,NOVOLIN N) 100 UNIT/ML injection Inject 4-16 Units into the skin See admin instructions. Use 4 units every morning and 16 units at bedtime 03/24/16  Yes Historical Provider, MD  insulin regular (NOVOLIN R,HUMULIN R) 250 units/2.15mL (100 units/mL) injection Inject 8 Units into the skin 3 (three) times daily. Sliding scale 03/24/16  Yes Historical Provider, MD  levothyroxine (SYNTHROID, LEVOTHROID) 75 MCG tablet  Take 1 tablet (75 mcg total) by mouth every other day. 02/08/15  Yes Adonis Brook, NP  Lurasidone HCl 20 MG TABS Take 1 tablet (20 mg total) by mouth daily with breakfast. 02/08/15  Yes Shuvon B Rankin, NP  Magnesium 400 MG TABS Take 400 mg by mouth daily.   Yes Historical Provider, MD  Omega-3 Fatty Acids (FISH OIL) 500 MG CAPS Take 500 mg by mouth daily.   Yes Historical Provider, MD  PARoxetine (PAXIL) 10 MG tablet Take 1 tablet (10 mg total) by mouth at bedtime. 02/08/15  Yes Shuvon B Rankin, NP  POTASSIUM PO Take 1 tablet by mouth daily.    Yes Historical Provider, MD  traZODone (DESYREL) 50 MG tablet Take 1 tablet (50 mg total) by mouth at bedtime as needed for sleep. 02/08/15  Yes Shuvon B Rankin, NP  Vilazodone HCl (VIIBRYD) 40 MG TABS Take 40 mg by mouth daily. 03/24/16  Yes Historical Provider, MD  insulin glargine (LANTUS) 100 UNIT/ML injection Inject 0.33 mLs (33 Units total) into the skin at bedtime. Patient not taking: Reported on 09/14/2016 02/08/15   Adonis Brook, NP  lisinopril (PRINIVIL,ZESTRIL) 10 MG tablet Take 1 tablet (10 mg total) by mouth daily. Patient not taking: Reported on 09/14/2016 02/08/15   Adonis Brook, NP       Physical Exam: BP 137/64   Pulse (!) 125   Temp 98.7 F (37.1 C) (Oral)   Resp 18   SpO2 100%  General appearance: Well-developed, adult male, alert and in mild distress from malaise.   Eyes: Anicteric, conjunctiva pink, lids and lashes normal. PERRL.    ENT: No nasal deformity, discharge, epistaxis.  Hearing normal. OP without lesions.  Dentition poor.  OP dry. Neck: No neck masses.  Trachea midline.  No thyromegaly/tenderness. Lymph: No cervical or supraclavicular lymphadenopathy. Skin: Warm and dry.  No jaundice.  No suspicious rashes or lesions. Cardiac: Tachycardic, regular, nl S1-S2, no murmurs appreciated by me.  Capillary refill is brisk.  JVP normal.  No LE edema.  Radial and DP pulses 2+ and symmetric. Respiratory: Normal respiratory rate and rhythm.  CTAB without rales or wheezes. Abdomen: Abdomen soft.  No focal TTP or gaurding. No ascites, distension, hepatosplenomegaly.   MSK: No deformities or effusions.  No cyanosis or clubbing. Neuro: Cranial nerves normal.  Sensation intact to light touch. Speech is fluent.  Muscle strength normal.    Psych: Sensorium intact and responding to questions, attention normal.  Behavior appropriate.  Affect normal.  Judgment and insight appear normal.     Labs on Admission:  I have personally reviewed following labs and imaging  studies: CBC:  Recent Labs Lab 09/14/16 2251 09/14/16 2258  WBC 16.2*  --   HGB 18.5* 19.4*  HCT 52.3* 57.0*  MCV 85.2  --   PLT 331  --    Basic Metabolic Panel:  Recent Labs Lab 09/14/16 2251 09/14/16 2258  NA 136 137  K 4.4 4.8  CL 99* 104  CO2 6*  --   GLUCOSE 344* 381*  BUN 14 18  CREATININE 2.04* 1.60*  CALCIUM 9.0  --    GFR: CrCl cannot be calculated (Unknown ideal weight.).  Liver Function Tests: No results for input(s): AST, ALT, ALKPHOS, BILITOT, PROT, ALBUMIN in the last 168 hours. No results for input(s): LIPASE, AMYLASE in the last 168 hours. No results for input(s): AMMONIA in the last 168 hours. Coagulation Profile: No results for input(s): INR, PROTIME in the last 168 hours. Cardiac  Enzymes: No results for input(s): CKTOTAL, CKMB, CKMBINDEX, TROPONINI in the last 168 hours. BNP (last 3 results) No results for input(s): PROBNP in the last 8760 hours. HbA1C: No results for input(s): HGBA1C in the last 72 hours. CBG:  Recent Labs Lab 09/14/16 2221 09/15/16 0048  GLUCAP 356* 268*   Lipid Profile: No results for input(s): CHOL, HDL, LDLCALC, TRIG, CHOLHDL, LDLDIRECT in the last 72 hours. Thyroid Function Tests: No results for input(s): TSH, T4TOTAL, FREET4, T3FREE, THYROIDAB in the last 72 hours. Anemia Panel: No results for input(s): VITAMINB12, FOLATE, FERRITIN, TIBC, IRON, RETICCTPCT in the last 72 hours. Sepsis Labs: Lactic acid 3.25 Invalid input(s): PROCALCITONIN, LACTICIDVEN No results found for this or any previous visit (from the past 240 hour(s)).       Radiological Exams on Admission: Personally reviewed CXR shows no focal opacities: Dg Chest 2 View  Result Date: 09/15/2016 CLINICAL DATA:  Chest and back pain, weakness for 3 days. Type 1 diabetes. EXAM: CHEST  2 VIEW COMPARISON:  Chest radiograph October 18, 2013 FINDINGS: Cardiomediastinal silhouette is normal. No pleural effusions or focal consolidations. Trachea projects  midline and there is no pneumothorax. Soft tissue planes and included osseous structures are non-suspicious. IMPRESSION: Normal chest. Electronically Signed   By: Awilda Metroourtnay  Bloomer M.D.   On: 09/15/2016 01:36    EKG: Independently reviewed. Rate 98, QTc 486, no ST changes, sinus tachycardia.      Assessment/Plan  1. Diabetic ketoacidosis in Type 1 diabetes:  Due to nonadherence to usual insulin regimen. -Insulin gtt -Hourly CBG -BMP and lactic acid q4hrs -Continue IVF -Obtain CXR and ECG -Check mag and phos    2. Acute kidney injury:  Baseline Cr 0.9. -Check urine electrolytes -Fluids and trend BMP  3. Leukocytosis:  Despite elevated lactic acid and leukocytosis, based on the evidence available to me, I believe this is reactive and the patient does NOT have sepsis syndrome and that his SIRS syndrome and elevated lactate are better explained by DKA alone. -Follow blood cultures  4. Hypothyroidism:  -Continue levothyroxine  5. Bipolar:  -Continue vilazodone and Depakote and Latuda      DVT prophylaxis: Lovenox  Code Status: FULL  Family Communication: Girlfriend at bedside  Disposition Plan: Anticipate IV fluids, IV insulin, close monitoring of electroyltes and renal function Consults called: None Admission status: INPATIENT       Medical decision making: Patient seen at 1:59 AM on 09/15/2016.  The patient was discussed with Dr. Dalene SeltzerSchlossman.  What exists of the patient's chart was reviewed in depth and summarized above.  Clinical condition: improving, currently stable, requiring insulin drip and frequent lab monitoring.        Alberteen SamChristopher P Zia Najera [facility] Pager [phone]  At the time of admission, it appears that the appropriate admission status for this patient is INPATIENT. This is judged to be reasonable and necessary in order to provide the required intensity of service to ensure the patient's safety given the presenting symptoms, physical exam  findings, and initial radiographic and laboratory data in the context of their chronic comorbidities.  Together, these circumstances are felt to place him at high risk for further clinical deterioration threatening life, limb, or organ.   Patient requires inpatient status due to high intensity of service, high risk for further deterioration and high frequency of surveillance required because of this acute illness that poses a threat to life, limb or bodily function.  I certify that at the point of admission it is my clinical judgment  that the patient will require inpatient hospital care spanning beyond 2 midnights from the point of admission and that early discharge would result in unnecessary risk of decompensation and readmission or threat to life, limb or bodily function.

## 2016-09-15 NOTE — Progress Notes (Addendum)
Inpatient Diabetes Program Recommendations  AACE/ADA: New Consensus Statement on Inpatient Glycemic Control (2015)  Target Ranges:  Prepandial:   less than 140 mg/dL      Peak postprandial:   less than 180 mg/dL (1-2 hours)      Critically ill patients:  140 - 180 mg/dL   Lab Results  Component Value Date   GLUCAP 155 (H) 09/15/2016   HGBA1C 12.9 (H) 02/06/2015    Review of Glycemic Control  Diabetes history: DM1 Outpatient Diabetes medications:     (*not able to afford to take per schedule)     NPH 4 units in am and 16 units QHS (last dose on 1/29)    Novolin R 8 units TIDAC meal coverage (last dose on 1/28) Current orders for Inpatient glycemic control:     Novolog 0-15 units TIDAC and 0-5 units QHS to start at 1700    Lantus 30 units daily to start at 1500    Insulin drip   Inpatient Diabetes Program Recommendations, please consider:     Meal coverage of Novolog 3 units TIDAC if patient eats > 50% of meal;     Decreasing to Sensitive correction scale Novolog 0-9 units TIDAC and 0-5 units QHS;     Waiting to discontinue IV insulin unitl 1 to 2 hours after patient receives Lantus.  If Lantus 30 units, Novolog 3 units meal coverage, and sensitive correction SSI control CBG's, please consider discharging patient on Novolin 70/30 Mix 20 units BID ([facility] and will cover patient 25 days).  This would be an affordable option for patient until CM can arrange for patient [facility].    Spoke with patient and girlfriend at bedside about the above and self-management of diabetes.  1.  Taught patient the following (teach back and/or return demonstration):  Hypo/Hyperglycemia  Medications at D/C (what these are, why taking, when taking, how taking, common S.E.'s)  CBG monitoring, A1C and why these are important  Carb modified diet  2.  Identified barriers and facilitators to self-management goals:  No  insurance, unable to afford insulin and monitoring supplies, no reliable work  3.  Identified support systems:  Girlfriend and bedside, dad, aunt, mother (less involved based on patient)  Text page sent to MD.  Thank you,  Kristine LineaKaren Tarris Delbene, RN, MSN Diabetes Coordinator Inpatient Diabetes Program [phone] (Team Pager)

## 2016-09-15 NOTE — Progress Notes (Signed)
No urine output noted, pt. Claimed that he voided in the ED prior to transfer to this unit. Urinal within reach.

## 2016-09-15 NOTE — ED Provider Notes (Signed)
[facility]-EMERGENCY DEPT Provider Note   CSN: [id number] Arrival date & time: 09/14/16  2209     History   Chief Complaint Chief Complaint  Patient presents with  . Hyperglycemia    HPI [name] is a 21 y.o. male.  HPI   Presents with concern for nausea and vomiting beginning this morning. Reports he has been trying to space out his insulin and take it in a way that it will last longer due to financial constraints. He began developing nausea and vomiting this morning which is consistent with his prior episodes of DKA. Reports he's vomited about 5 times. Denies associated abdominal pain. Reports he developed back pain after the second or third time he vomited, and that has slowly gotten worse. Denies fevers, cough, congestion, dysuria, chest pain and shortness of breath.  Reports the back pain that he developed is now severe, located on the right lower side. Denies loss of control of bowel or bladder, denies numbness or weakness. Denies a history of IV drug use when asked alone.  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   Past Medical History:  Diagnosis Date  . ADHD (attention deficit hyperactivity disorder)   . Diabetes mellitus    Dec 2007  . Diabetic arthropathy (HCC)   . Goiter   . Hypertension   . Hypertension   . Hypoglycemia associated with diabetes (HCC)   . Hypothyroidism   . Mononucleosis   . Physical growth delay   . Tachycardia   . Thyroiditis, autoimmune   . Type 1 diabetes mellitus not at goal [facility])   . Vision abnormalities glasses    Patient Active Problem List   Diagnosis Date Noted  . Diabetic ketoacidosis (HCC) 09/15/2016  . Transient alteration of awareness   . Type 1 diabetes mellitus, uncontrolled (HCC) 02/06/2015  . Hyperglycemia due to type 1 diabetes mellitus (HCC) 02/06/2015  . Hyponatremia 02/06/2015  . Bipolar 1 disorder, manic, moderate (HCC) 02/05/2015  . Intermittent explosive disorder   . Bipolar  affective disorder, current episode depressed (HCC) 02/03/2015  . Aggressive behavior 02/02/2015  . Suicidal ideation 02/02/2015  . Hyperglycemia   . Suicidal ideations   . Non compliance with medical treatment 07/11/2013  . Hypothyroidism, acquired, autoimmune 08/20/2012  . Periapical abscess 10/24/2011  . Facial cellulitis 10/24/2011  . Type 1 diabetes mellitus not at goal [facility])   . Goiter   . Hypoglycemia associated with diabetes (HCC)   . Physical growth delay   . Thyroiditis, autoimmune   . Autonomic neuropathy due to diabetes (HCC)   . Tachycardia   . Diabetic arthropathy (HCC)   . DKA (diabetic ketoacidoses) (HCC) 06/29/2011  . Pneumothorax 06/28/2011    Class: Acute  . Dehydration, moderate 06/28/2011    Class: Acute  . HYPERTENSION NEC 04/08/2010    Past Surgical History:  Procedure Laterality Date  . TONSILLECTOMY  at young age       Home Medications    Prior to Admission medications   Medication Sig Start Date End Date Taking? Authorizing Provider  divalproex (DEPAKOTE ER) 500 MG 24 hr tablet Take 1 tablet (500 mg total) by mouth 2 (two) times daily. 02/08/15  Yes Shuvon B Rankin, NP  hydrOXYzine (ATARAX/VISTARIL) 25 MG tablet Take 1 tablet (25 mg total) by mouth every 6 (six) hours as needed for anxiety. 02/08/15  Yes Adonis Brook, NP  insulin NPH Human (HUMULIN N,NOVOLIN N) 100 UNIT/ML injection Inject 4-16 Units into the skin See  admin instructions. Use 4 units every morning and 16 units at bedtime 03/24/16  Yes Historical Provider, MD  insulin regular (NOVOLIN R,HUMULIN R) 250 units/2.46mL (100 units/mL) injection Inject 8 Units into the skin 3 (three) times daily. Sliding scale 03/24/16  Yes Historical Provider, MD  levothyroxine (SYNTHROID, LEVOTHROID) 75 MCG tablet Take 1 tablet (75 mcg total) by mouth every other day. 02/08/15  Yes Adonis Brook, NP  Lurasidone HCl 20 MG TABS Take 1 tablet (20 mg total) by mouth daily with breakfast. 02/08/15  Yes Shuvon B  Rankin, NP  Magnesium 400 MG TABS Take 400 mg by mouth daily.   Yes Historical Provider, MD  Omega-3 Fatty Acids (FISH OIL) 500 MG CAPS Take 500 mg by mouth daily.   Yes Historical Provider, MD  PARoxetine (PAXIL) 10 MG tablet Take 1 tablet (10 mg total) by mouth at bedtime. 02/08/15  Yes Shuvon B Rankin, NP  POTASSIUM PO Take 1 tablet by mouth daily.   Yes Historical Provider, MD  traZODone (DESYREL) 50 MG tablet Take 1 tablet (50 mg total) by mouth at bedtime as needed for sleep. 02/08/15  Yes Shuvon B Rankin, NP  Vilazodone HCl (VIIBRYD) 40 MG TABS Take 40 mg by mouth daily. 03/24/16  Yes Historical Provider, MD  insulin glargine (LANTUS) 100 UNIT/ML injection Inject 0.33 mLs (33 Units total) into the skin at bedtime. Patient not taking: Reported on 09/14/2016 02/08/15   Adonis Brook, NP  lisinopril (PRINIVIL,ZESTRIL) 10 MG tablet Take 1 tablet (10 mg total) by mouth daily. Patient not taking: Reported on 09/14/2016 02/08/15   Adonis Brook, NP    Family History Family History  Problem Relation Age of Onset  . Stroke Mother   . COPD Father   . Diabetes Maternal Grandmother     Social History Social History  Substance Use Topics  . Smoking status: Never Smoker  . Smokeless tobacco: Never Used  . Alcohol use No     Allergies   Patient has no known allergies.   Review of Systems Review of Systems  Constitutional: Positive for fatigue. Negative for fever.  HENT: Negative for sore throat.   Eyes: Negative for visual disturbance.  Respiratory: Positive for shortness of breath (similar to DKA).   Cardiovascular: Negative for chest pain.  Gastrointestinal: Positive for nausea and vomiting. Negative for abdominal pain.  Genitourinary: Negative for difficulty urinating.  Musculoskeletal: Positive for back pain. Negative for neck stiffness.  Skin: Negative for rash.  Neurological: Negative for syncope, weakness, numbness and headaches.     Physical Exam Updated Vital Signs BP  137/64   Pulse (!) 125   Temp 98.7 F (37.1 C) (Oral)   Resp 18   SpO2 100%   Physical Exam  Constitutional: He is oriented to person, place, and time. He appears well-developed and well-nourished. No distress.  HENT:  Head: Normocephalic and atraumatic.  Poor dentition  Eyes: Conjunctivae and EOM are normal. Pupils are equal, round, and reactive to light.  Neck: Normal range of motion.  Cardiovascular: Regular rhythm and intact distal pulses.  Tachycardia present.  Exam reveals no gallop and no friction rub.   Murmur heard. Pulmonary/Chest: Effort normal and breath sounds normal. Tachypnea noted. No respiratory distress. He has no wheezes. He has no rales.  Abdominal: Soft. He exhibits no distension. There is no tenderness. There is no guarding.  Musculoskeletal: He exhibits no edema.       Lumbar back: He exhibits tenderness and bony tenderness (mild).  Neurological: He is  alert and oriented to person, place, and time.  Skin: Skin is warm and dry. He is not diaphoretic.  Multiple healing scabs over arms, legs  Nursing note and vitals reviewed.    ED Treatments / Results  Labs (all labs ordered are listed, but only abnormal results are displayed) Labs Reviewed  BASIC METABOLIC PANEL - Abnormal; Notable for the following:       Result Value   Chloride 99 (*)    CO2 6 (*)    Glucose, Bld 344 (*)    Creatinine, Ser 2.04 (*)    GFR calc non Af Amer 45 (*)    GFR calc Af Amer 52 (*)    Anion gap 31 (*)    All other components within normal limits  CBC - Abnormal; Notable for the following:    WBC 16.2 (*)    RBC 6.14 (*)    Hemoglobin 18.5 (*)    HCT 52.3 (*)    All other components within normal limits  URINALYSIS, ROUTINE W REFLEX MICROSCOPIC - Abnormal; Notable for the following:    APPearance HAZY (*)    Glucose, UA >=500 (*)    Hgb urine dipstick SMALL (*)    Ketones, ur 80 (*)    Protein, ur 100 (*)    Bacteria, UA RARE (*)    Squamous Epithelial / LPF 0-5 (*)     All other components within normal limits  CBG MONITORING, ED - Abnormal; Notable for the following:    Glucose-Capillary 356 (*)    All other components within normal limits  CBG MONITORING, ED - Abnormal; Notable for the following:    Glucose-Capillary 268 (*)    All other components within normal limits  I-STAT CHEM 8, ED - Abnormal; Notable for the following:    Creatinine, Ser 1.60 (*)    Glucose, Bld 381 (*)    Hemoglobin 19.4 (*)    HCT 57.0 (*)    All other components within normal limits  I-STAT CG4 LACTIC ACID, ED - Abnormal; Notable for the following:    Lactic Acid, Venous 3.25 (*)    All other components within normal limits  I-STAT VENOUS BLOOD GAS, ED - Abnormal; Notable for the following:    pH, Ven 7.012 (*)    pCO2, Ven 23.9 (*)    Bicarbonate 6.1 (*)    Acid-base deficit 24.0 (*)    All other components within normal limits  CULTURE, BLOOD (ROUTINE X 2)  CULTURE, BLOOD (ROUTINE X 2)  URINE CULTURE  BASIC METABOLIC PANEL  BASIC METABOLIC PANEL  MAGNESIUM  PHOSPHORUS  CBC  SODIUM, URINE, RANDOM  CREATININE, URINE, RANDOM  LACTIC ACID, PLASMA  LACTIC ACID, PLASMA    EKG  EKG Interpretation None       Radiology Dg Chest 2 View  Result Date: 09/15/2016 CLINICAL DATA:  Chest and back pain, weakness for 3 days. Type 1 diabetes. EXAM: CHEST  2 VIEW COMPARISON:  Chest radiograph October 18, 2013 FINDINGS: Cardiomediastinal silhouette is normal. No pleural effusions or focal consolidations. Trachea projects midline and there is no pneumothorax. Soft tissue planes and included osseous structures are non-suspicious. IMPRESSION: Normal chest. Electronically Signed   By: Awilda Metro M.D.   On: 09/15/2016 01:36    Procedures Procedures (including critical care time)  Medications Ordered in ED Medications  0.9 %  sodium chloride infusion (not administered)  0.9 %  sodium chloride infusion (not administered)  dextrose 5 %-0.45 % sodium chloride infusion  (  not administered)  insulin regular (NOVOLIN R,HUMULIN R) 250 Units in sodium chloride 0.9 % 250 mL (1 Units/mL) infusion (not administered)  potassium chloride SA (K-DUR,KLOR-CON) CR tablet 20 mEq (not administered)  enoxaparin (LOVENOX) injection 40 mg (not administered)  sodium chloride flush (NS) 0.9 % injection 3 mL (not administered)  acetaminophen (TYLENOL) tablet 650 mg (not administered)    Or  acetaminophen (TYLENOL) suppository 650 mg (not administered)  ondansetron (ZOFRAN) tablet 4 mg (not administered)    Or  ondansetron (ZOFRAN) injection 4 mg (not administered)  sodium chloride 0.9 % bolus 1,000 mL (0 mLs Intravenous Stopped 09/14/16 2345)  sodium chloride 0.9 % bolus 1,000 mL (0 mLs Intravenous Stopped 09/14/16 2346)  ondansetron (ZOFRAN) injection 4 mg (4 mg Intravenous Given 09/14/16 2302)  morphine 4 MG/ML injection 4 mg (4 mg Intravenous Given 09/14/16 2303)  morphine 4 MG/ML injection 4 mg (4 mg Intravenous Given 09/15/16 0010)    CRITICAL CARE: DKA Performed by: Rhae LernerSchlossman, Natacia Chaisson Elizabeth   Total critical care time: 30 minutes  Critical care time was exclusive of separately billable procedures and treating other patients.  Critical care was necessary to treat or prevent imminent or life-threatening deterioration.  Critical care was time spent personally by me on the following activities: development of treatment plan with patient and/or surrogate as well as nursing, discussions with consultants, evaluation of patient's response to treatment, examination of patient, obtaining history from patient or surrogate, ordering and performing treatments and interventions, ordering and review of laboratory studies, ordering and review of radiographic studies, pulse oximetry and re-evaluation of patient's condition.  Initial Impression / Assessment and Plan / ED Course  I have reviewed the triage vital signs and the nursing notes.  Pertinent labs & imaging results that were  available during my care of the patient were reviewed by me and considered in my medical decision making (see chart for details).      Final Clinical Impressions(s) / ED Diagnoses  21 year old male with a history of type 1 diabetes presents with concern for nausea and vomiting in the setting of spacing out his insulin use. Patient history labs are consistent with diabetic ketoacidosis, which is likely secondary to decreased insulin. Patient was given 2 L of normal saline for rehydration, place on maintenance fluids, and insulin drip. Lactic acid and Creatinine likely elevated secondary to dehydration from DKA.  Patient does report back pain which developed after he began vomiting consistent with likely muscle strain, denies any red flags of back pain, and have low suspicion for epidural abscess at this time.  Question murmur on exam, likely rate related. Patient denies IVDU, is afebrile, however will obtain blood cultures to screen for abnormalities. Given low suspicion for infection at this time will not begin empiric abx.  Patient admitted to SDU for further care of DKA with pH 7.012, bicarb 6, AKI, dehydration.   Final diagnoses:  Leukocytosis  Diabetic ketoacidosis without coma associated with type 1 diabetes mellitus (HCC)    New Prescriptions New Prescriptions   No medications on file     Alvira MondayErin Yarelli Decelles, MD 09/15/16 0200

## 2016-09-15 NOTE — Progress Notes (Signed)
Have called [facility] they will not take new pt's til feb 1 and will not give me appt. Have called and left message w [facility] to see if they will take new pt. Cont to follow.

## 2016-09-15 NOTE — Progress Notes (Signed)
PROGRESS NOTE                                                                                                                                                                                                             Patient Demographics:    [name], is a 21 y.o. male, DOB - August 04, 1996, VHQ:[id number]  Admit date - 09/14/2016   Admitting Physician Alberteen Samhristopher P Danford, MD  Outpatient Primary MD for the patient is PROVIDER NOT IN SYSTEM  LOS - 0  Outpatient Specialists:None  Chief Complaint  Patient presents with  . Hyperglycemia       Brief Narrative   21 year old male with history of type 1 diabetes mellitus, bipolar disorder and hypothyroidism presented with one-day history of vomiting and malaise. He was running out of his insulin and supplies. He does not see a PCP and was buying insulin over-the-counter. CBG has been in high 200s to 300s recently. Patient found to be in severe DKA and admitted to stepdown unit.   Subjective:   Patient reports feeling very tired but denies further nausea or vomiting.   Assessment  & Plan :    Principal Problem:   DKA (diabetic ketoacidoses) (HCC) Due to medication nonadherence and missing out off his insulin for past several days. Patient informs that he does not have insurance and was not able to afford his insulin. Received IV fluid bolus followed by maintenance fluid in the ED and started on insulin drip. Anion gap still 16 but slowly improving. Once anion gap closes will start him on long-acting insulin and discontinue drip. Continue step down monitoring for today. Check A1c. Case management consulted for medication assistance and help in  [facility].  Active Problems:   Hypothyroidism, acquired, autoimmune Continue Synthroid.    Bipolar 1 disorder, manic, moderate (HCC) On latuda and Paxil.    Type 1 diabetes mellitus,  uncontrolled (HCC) As outlined above. Patient counseled in detail about medication adherence, blood glucose monitoring and need for establishing outpatient care.    AKI (acute kidney injury) (HCC) Secondary to severe dehydration. Monitor with fluids    Leukocytosis Secondary to acute distress. Skin signs of infection.      Code Status : full code Family Communication  : girlfriend at bedside  Disposition Plan  : Home possibly tomorrow if  improved  Barriers For Discharge : Active symptoms, DKA  Consults  :  None  Procedures  : None  DVT Prophylaxis  :  Lovenox -  Lab Results  Component Value Date   PLT 241 09/15/2016    Antibiotics  :    Anti-infectives    None        Objective:   Vitals:   09/15/16 0600 09/15/16 0615 09/15/16 0630 09/15/16 0900  BP: 125/58 120/60 121/61   Pulse: 100 102 99   Resp: 15 21 15    Temp:      TempSrc:      SpO2: 99% 98% 100%   Weight:    76.8 kg (169 lb 5 oz)  Height:    5\' 11"  (1.803 m)    Wt Readings from Last 3 Encounters:  09/15/16 76.8 kg (169 lb 5 oz)  06/12/14 83.9 kg (185 lb) (84 %, Z= 1.00)*  11/05/13 83.9 kg (185 lb) (86 %, Z= 1.07)*   * Growth percentiles are based on CDC 2-20 Years data.     Intake/Output Summary (Last 24 hours) at 09/15/16 1119 Last data filed at 09/15/16 0203  Gross per 24 hour  Intake          2281.25 ml  Output                0 ml  Net          2281.25 ml     Physical Exam  Gen: not in distress,  appears fatigued HEENT: Dry mucosa, supple neck Chest: clear b/l, no added sounds CVS: N S1&S2, no murmurs, GI: soft, NT, ND, BS+ Musculoskeletal: warm, no edema     Data Review:    CBC  Recent Labs Lab 09/14/16 2251 09/14/16 2258 09/15/16 0205  WBC 16.2*  --  21.4*  HGB 18.5* 19.4* 15.5  HCT 52.3* 57.0* 44.2  PLT 331  --  241  MCV 85.2  --  85.2  MCH 30.1  --  29.9  MCHC 35.4  --  35.1  RDW 13.1  --  12.7    Chemistries   Recent Labs Lab 09/14/16 2251  09/14/16 2258 09/15/16 0205 09/15/16 0731  NA 136 137 136 138  K 4.4 4.8 4.5 4.2  CL 99* 104 108 109  CO2 6*  --  9* 12*  GLUCOSE 344* 381* 181* 155*  BUN 14 18 12 11   CREATININE 2.04* 1.60* 1.83* 1.54*  CALCIUM 9.0  --  7.6* 8.2*  MG  --   --  1.7  --    ------------------------------------------------------------------------------------------------------------------ No results for input(s): CHOL, HDL, LDLCALC, TRIG, CHOLHDL, LDLDIRECT in the last 72 hours.  Lab Results  Component Value Date   HGBA1C 12.9 (H) 02/06/2015   ------------------------------------------------------------------------------------------------------------------ No results for input(s): TSH, T4TOTAL, T3FREE, THYROIDAB in the last 72 hours.  Invalid input(s): FREET3 ------------------------------------------------------------------------------------------------------------------ No results for input(s): VITAMINB12, FOLATE, FERRITIN, TIBC, IRON, RETICCTPCT in the last 72 hours.  Coagulation profile No results for input(s): INR, PROTIME in the last 168 hours.  No results for input(s): DDIMER in the last 72 hours.  Cardiac Enzymes No results for input(s): CKMB, TROPONINI, MYOGLOBIN in the last 168 hours.  Invalid input(s): CK ------------------------------------------------------------------------------------------------------------------ No results found for: BNP  Inpatient Medications  Scheduled Meds: . divalproex  500 mg Oral BID  . enoxaparin (LOVENOX) injection  40 mg Subcutaneous Q24H  . [START ON 09/16/2016] Influenza vac split quadrivalent PF  0.5 mL Intramuscular Tomorrow-1000  . levothyroxine  75 mcg Oral Q48H  . lurasidone  20 mg Oral Q breakfast  . PARoxetine  10 mg Oral QHS  . sodium chloride flush  3 mL Intravenous Q12H  . Vilazodone HCl  40 mg Oral Daily   Continuous Infusions: . sodium chloride 150 mL/hr at 09/15/16 0308  . dextrose 5 % and 0.45% NaCl 125 mL/hr at 09/15/16 0403   . insulin (NOVOLIN-R) infusion 0.5 mL/hr at 09/15/16 1000   PRN Meds:.acetaminophen **OR** acetaminophen, ondansetron **OR** ondansetron (ZOFRAN) IV, traZODone  Micro Results Recent Results (from the past 240 hour(s))  MRSA PCR Screening     Status: None   Collection Time: 09/15/16  9:26 AM  Result Value Ref Range Status   MRSA by PCR NEGATIVE NEGATIVE Final    Comment:        The GeneXpert MRSA Assay (FDA approved for NASAL specimens only), is one component of a comprehensive MRSA colonization surveillance program. It is not intended to diagnose MRSA infection nor to guide or monitor treatment for MRSA infections.     Radiology Reports Dg Chest 2 View  Result Date: 09/15/2016 CLINICAL DATA:  Chest and back pain, weakness for 3 days. Type 1 diabetes. EXAM: CHEST  2 VIEW COMPARISON:  Chest radiograph October 18, 2013 FINDINGS: Cardiomediastinal silhouette is normal. No pleural effusions or focal consolidations. Trachea projects midline and there is no pneumothorax. Soft tissue planes and included osseous structures are non-suspicious. IMPRESSION: Normal chest. Electronically Signed   By: Awilda Metro M.D.   On: 09/15/2016 01:36    Time Spent in minutes  25   Eddie North M.D on 09/15/2016 at 11:19 AM  Between 7am to 7pm - Pager - [phone]  After 7pm go to [email or web address] - password [facility]  [facility] -  Office  [phone]

## 2016-09-15 NOTE — Progress Notes (Signed)
Lantus  Insulin 30 units given. Cont. with every hour cbg for 2 hours with insulin gtt.

## 2016-09-16 LAB — HEMOGLOBIN A1C
HEMOGLOBIN A1C: 14.1 % — AB (ref 4.8–5.6)
Mean Plasma Glucose: 358 mg/dL

## 2016-09-16 LAB — BASIC METABOLIC PANEL
Anion gap: 10 (ref 5–15)
BUN: 7 mg/dL (ref 6–20)
CHLORIDE: 109 mmol/L (ref 101–111)
CO2: 22 mmol/L (ref 22–32)
Calcium: 8.7 mg/dL — ABNORMAL LOW (ref 8.9–10.3)
Creatinine, Ser: 1.14 mg/dL (ref 0.61–1.24)
GFR calc Af Amer: >60 mL/min (ref >60)
GFR calc non Af Amer: >60 mL/min (ref >60)
Glucose, Bld: 147 mg/dL — ABNORMAL HIGH (ref 65–99)
POTASSIUM: 2.8 mmol/L — AB (ref 3.5–5.1)
SODIUM: 141 mmol/L (ref 135–145)

## 2016-09-16 LAB — URINE CULTURE: CULTURE: NO GROWTH

## 2016-09-16 LAB — GLUCOSE, CAPILLARY: GLUCOSE-CAPILLARY: 133 mg/dL — AB (ref 65–99)

## 2016-09-16 MED ORDER — INSULIN NPH (HUMAN) (ISOPHANE) 100 UNIT/ML ~~LOC~~ SUSP: 15.0000 [IU] | Freq: Two times a day (BID) | SUBCUTANEOUS | 0 refills | Status: AC

## 2016-09-16 MED ORDER — POTASSIUM CHLORIDE CRYS ER 20 MEQ PO TBCR
40.0000 meq | EXTENDED_RELEASE_TABLET | ORAL | Status: DC
  Administered 2016-09-16: 40 meq via ORAL
  Filled 2016-09-16: qty 2

## 2016-09-16 MED ORDER — INSULIN REGULAR HUMAN 100 UNIT/ML IJ SOLN: 4.0000 [IU] | Freq: Three times a day (TID) | INTRAMUSCULAR | 0 refills | Status: DC

## 2016-09-16 MED ORDER — INSULIN NPH (HUMAN) (ISOPHANE) 100 UNIT/ML ~~LOC~~ SUSP: 20.0000 [IU] | Freq: Two times a day (BID) | SUBCUTANEOUS | 0 refills | Status: DC

## 2016-09-16 MED ORDER — POTASSIUM CHLORIDE CRYS ER 20 MEQ PO TBCR: 40.0000 meq | EXTENDED_RELEASE_TABLET | Freq: Once | ORAL | Status: DC

## 2016-09-16 MED ORDER — INSULIN REGULAR HUMAN 100 UNIT/ML IJ SOLN: 8.0000 [IU] | Freq: Three times a day (TID) | INTRAMUSCULAR | 0 refills | Status: AC

## 2016-09-16 NOTE — Progress Notes (Addendum)
Inpatient Diabetes Program Recommendations  AACE/ADA: New Consensus Statement on Inpatient Glycemic Control (2015)  Target Ranges:  Prepandial:   less than 140 mg/dL      Peak postprandial:   less than 180 mg/dL (1-2 hours)      Critically ill patients:  140 - 180 mg/dL   Lab Results  Component Value Date   GLUCAP 133 (H) 09/16/2016   HGBA1C 12.9 (H) 02/06/2015   Inpatient Diabetes Program Recommendations, please consider:     Meal coverage of Novolog 4 units TIDAC if patient eats > 50% of meal;     Decreasing to Sensitive correction scale Novolog 0-9 units TIDAC and 0-5 units QHS;  This regimen will set patient up to be discharged on Novolin 70/30 Mix 20 units BID so that patient can get cheaper insulin (will only cost 1$/day) from [employer].  Thank you,  Kristine LineaKaren Nemiah Kissner, RN, MSN Diabetes Coordinator Inpatient Diabetes Program [phone] (Team Pager)

## 2016-09-16 NOTE — Progress Notes (Signed)
Cm was able to get pt appt w [facility] on 09-25-16 at 9:15 am, pt will need to be there at 9am.

## 2016-09-18 NOTE — Discharge Summary (Signed)
[facility] Discharge Summary   Patient: [name] ZOX:[id number]   PCP: PROVIDER NOT IN SYSTEM DOB: September 27, 1995   Date of admission: 09/14/2016   Date of discharge: 09/16/2016     Discharge Diagnoses:  Principal Problem:   DKA (diabetic ketoacidoses) (HCC) Active Problems:   Hypothyroidism, acquired, autoimmune   Bipolar 1 disorder, manic, moderate (HCC)   Type 1 diabetes mellitus, uncontrolled (HCC)   AKI (acute kidney injury) (HCC)   Leukocytosis   Diabetic ketoacidosis without coma associated with type 1 diabetes mellitus (HCC)   Admitted From: home Disposition:  home  Recommendations for Outpatient Follow-up:  1. Follow-up with PCP in one week   Follow-up Information    [facility] Follow up on 09/25/2016.   Why:  appt at 9:15am please be there at 9am Contact information: [address] [place] [address] [phone]         Diet recommendation: carb modified diet  Activity: The patient is advised to gradually reintroduce usual activities.  Discharge Condition: good  Code Status: full code  History of present illness: As per the H and P dictated on admission, " [name] is a 21 y.o. male with a past medical history significant for T1DM, Bipolar 1 and hypothyroidism who presents with 1 day vomiting and malaise.  The patient has been running out of insulin recently and test strips and so he is taking his sugar only once a day and stretching out his insulin.  He was noticing BGs 250-350s here recently and then today woke with nausea, recurrent NBNB emesis and abdominal discomfort.  The vomiting persisted all day until he was just "dry heaving acid" and had chest pain and right low back pain and felt weak and so he came to the ER."  Hospital Course:   Summary of his active problems in the hospital is as following. 1. Diabetic ketoacidosis in Type 1 diabetes:  Due to nonadherence to usual insulin  regimen. -Insulin gtt Was started, and on the abdomen closed, patient was able to tolerate oral diet. Transitioning to home NPH, dose increased from 40 and 16 twice a day to 15 units twice a day. Recommending patient to remain compliant with regimen.  2. Acute kidney injury:  Baseline Cr 0.9. -Check urine electrolytes -Fluids and trend BMP  3. Leukocytosis:  patient does NOT have sepsis syndrome and that his SIRS syndrome and elevated lactate are better explained by DKA alone.  4. Hypothyroidism:  -Continue levothyroxine  5. Bipolar:  -Continue vilazodone and Depakote and Latuda  All other chronic medical condition were stable during the hospitalization.  Patient was ambulatory without any assistance. On the day of the discharge the patient's vitals were stable, and no other acute medical condition were reported by patient. the patient was felt safe to be discharge at home with family.  Procedures and Results:  none   Consultations:  nonoe  DISCHARGE MEDICATION: Discharge Medication List as of 09/16/2016 10:46 AM    CONTINUE these medications which have CHANGED   Details  insulin NPH Human (HUMULIN N,NOVOLIN N) 100 UNIT/ML injection Inject 0.15 mLs (15 Units total) into the skin 2 (two) times daily before a meal., Starting Wed 09/16/2016, Normal    insulin regular (NOVOLIN R,HUMULIN R) 250 units/2.74mL (100 units/mL) injection Inject 0.08 mLs (8 Units total) into the skin 3 (three) times daily. Sliding scale, Starting Wed 09/16/2016, Normal      CONTINUE these medications which have NOT CHANGED  Details  divalproex (DEPAKOTE ER) 500 MG 24 hr tablet Take 1 tablet (500 mg total) by mouth 2 (two) times daily., Starting Fri 02/08/2015, Print    hydrOXYzine (ATARAX/VISTARIL) 25 MG tablet Take 1 tablet (25 mg total) by mouth every 6 (six) hours as needed for anxiety., Starting Fri 02/08/2015, Print    levothyroxine (SYNTHROID, LEVOTHROID) 75 MCG tablet Take 1 tablet (75 mcg  total) by mouth every other day., Starting Fri 02/08/2015, No Print    Lurasidone HCl 20 MG TABS Take 1 tablet (20 mg total) by mouth daily with breakfast., Starting Fri 02/08/2015, Print    Magnesium 400 MG TABS Take 400 mg by mouth daily., Historical Med    Omega-3 Fatty Acids (FISH OIL) 500 MG CAPS Take 500 mg by mouth daily., Historical Med    PARoxetine (PAXIL) 10 MG tablet Take 1 tablet (10 mg total) by mouth at bedtime., Starting Fri 02/08/2015, Print    POTASSIUM PO Take 1 tablet by mouth daily., Historical Med    traZODone (DESYREL) 50 MG tablet Take 1 tablet (50 mg total) by mouth at bedtime as needed for sleep., Starting Fri 02/08/2015, Print    Vilazodone HCl (VIIBRYD) 40 MG TABS Take 40 mg by mouth daily., Starting Tue 03/24/2016, Historical Med       No Known Allergies Discharge Instructions    Diet - low sodium heart healthy    Complete by:  As directed    Diet Carb Modified    Complete by:  As directed    Discharge instructions    Complete by:  As directed    It is important that you read following instructions as well as go over your medication list with RN to help you understand your care after this hospitalization.  Discharge Instructions: Please follow-up with PCP in one week  Please request your primary care physician to go over all Hospital Tests and Procedure/Radiological results at the follow up,  Please get all Hospital records sent to your PCP by signing hospital release before you go home.   Do not take more than prescribed Pain, Sleep and Anxiety Medications. You were cared for by a hospitalist during your hospital stay. If you have any questions about your discharge medications or the care you received while you were in the hospital after you are discharged, you can call the unit and ask to speak with the hospitalist on call if the hospitalist that took care of you is not available.  Once you are discharged, your primary care physician will handle any  further medical issues. Please note that NO REFILLS for any discharge medications will be authorized once you are discharged, as it is imperative that you return to your primary care physician (or establish a relationship with a primary care physician if you do not have one) for your aftercare needs so that they can reassess your need for medications and monitor your lab values. You Must read complete instructions/literature along with all the possible adverse reactions/side effects for all the Medicines you take and that have been prescribed to you. Take any new Medicines after you have completely understood and accept all the possible adverse reactions/side effects. Wear Seat belts while driving. If you have smoked or chewed Tobacco in the last 2 yrs please stop smoking and/or stop any Recreational drug use.   Increase activity slowly    Complete by:  As directed      Discharge Exam: Filed Weights   09/15/16 0900  Weight: 76.8 kg (  169 lb 5 oz)   Vitals:   09/16/16 0401 09/16/16 0831  BP: 110/60 112/62  Pulse: 76 66  Resp: 16 12  Temp: 98.6 F (37 C) 97.7 F (36.5 C)   General: Appear in mild distress,  Rash; Oral Mucosa moist. Cardiovascular: S1 and S2 Present, no Murmur, no JVD Respiratory: Bilateral Air entry present and Clear to Auscultation, no Crackles, no wheezes Abdomen: Bowel Sound present, Soft and no tenderness Extremities: no Pedal edema, no calf tenderness Neurology: Grossly no focal neuro deficit.  The results of significant diagnostics from this hospitalization (including imaging, microbiology, ancillary and laboratory) are listed below for reference.    Significant Diagnostic Studies: Dg Chest 2 View  Result Date: 09/15/2016 CLINICAL DATA:  Chest and back pain, weakness for 3 days. Type 1 diabetes. EXAM: CHEST  2 VIEW COMPARISON:  Chest radiograph October 18, 2013 FINDINGS: Cardiomediastinal silhouette is normal. No pleural effusions or focal consolidations. Trachea  projects midline and there is no pneumothorax. Soft tissue planes and included osseous structures are non-suspicious. IMPRESSION: Normal chest. Electronically Signed   By: Awilda Metro M.D.   On: 09/15/2016 01:36    Microbiology: Recent Results (from the past 240 hour(s))  Urine culture     Status: None   Collection Time: 09/14/16 10:22 PM  Result Value Ref Range Status   Specimen Description URINE, RANDOM  Final   Special Requests ADDED [id number]  Final   Culture NO GROWTH  Final   Report Status 09/16/2016 FINAL  Final  Blood culture (routine x 2)     Status: None (Preliminary result)   Collection Time: 09/15/16  1:16 AM  Result Value Ref Range Status   Specimen Description BLOOD LEFT ARM  Final   Special Requests BOTTLES DRAWN AEROBIC AND ANAEROBIC  Final   Culture NO GROWTH 2 DAYS  Final   Report Status PENDING  Incomplete  Blood culture (routine x 2)     Status: None (Preliminary result)   Collection Time: 09/15/16  2:19 AM  Result Value Ref Range Status   Specimen Description BLOOD LEFT HAND  Final   Special Requests IN PEDIATRIC BOTTLE  Final   Culture NO GROWTH 2 DAYS  Final   Report Status PENDING  Incomplete  MRSA PCR Screening     Status: None   Collection Time: 09/15/16  9:26 AM  Result Value Ref Range Status   MRSA by PCR NEGATIVE NEGATIVE Final    Comment:        The GeneXpert MRSA Assay (FDA approved for NASAL specimens only), is one component of a comprehensive MRSA colonization surveillance program. It is not intended to diagnose MRSA infection nor to guide or monitor treatment for MRSA infections.      Labs: CBC:  Recent Labs Lab 09/14/16 2251 09/14/16 2258 09/15/16 0205  WBC 16.2*  --  21.4*  HGB 18.5* 19.4* 15.5  HCT 52.3* 57.0* 44.2  MCV 85.2  --  85.2  PLT 331  --  241   Basic Metabolic Panel:  Recent Labs Lab 09/15/16 0205 09/15/16 0731 09/15/16 1109 09/15/16 1546 09/16/16 0747  NA 136 138 136 138 141  K 4.5 4.2  3.7 3.6 2.8*  CL 108 109 109 110 109  CO2 9* 12* 15* 20* 22  GLUCOSE 181* 155* 165* 147* 147*  BUN 12 11 8 8 7   CREATININE 1.83* 1.54* 1.35* 1.16 1.14  CALCIUM 7.6* 8.2* 8.1* 8.7* 8.7*  MG 1.7  --   --   --   --  PHOS 3.2  --   --   --   --   CBG:  Recent Labs Lab 09/15/16 1433 09/15/16 1537 09/15/16 1637 09/15/16 2133 09/16/16 0830  GLUCAP 144* 158* 157* 180* 133*   Time spent: 30 minutes  Signed:  PATEL, PRANAV  [facility] 09/16/2016  , 2:23 PM

## 2016-09-20 LAB — CULTURE, BLOOD (ROUTINE X 2)
CULTURE: NO GROWTH
Culture: NO GROWTH

## 2016-09-24 ENCOUNTER — Telehealth: Payer: Self-pay | Admitting: Internal Medicine

## 2016-09-24 NOTE — Telephone Encounter (Signed)
Calling to confirm appt for 09/25/16 at 9:15

## 2016-09-25 ENCOUNTER — Ambulatory Visit: Payer: Self-pay

## 2018-07-21 IMAGING — DX DG CHEST 2V
2 series · 2 of 2 positions shown · non-contrast
Comparison: Chest radiograph October 18, 2013

CLINICAL DATA: Chest and back pain, weakness for 3 days. Type 1
diabetes.

EXAM:
CHEST  2 VIEW

[chest pa]
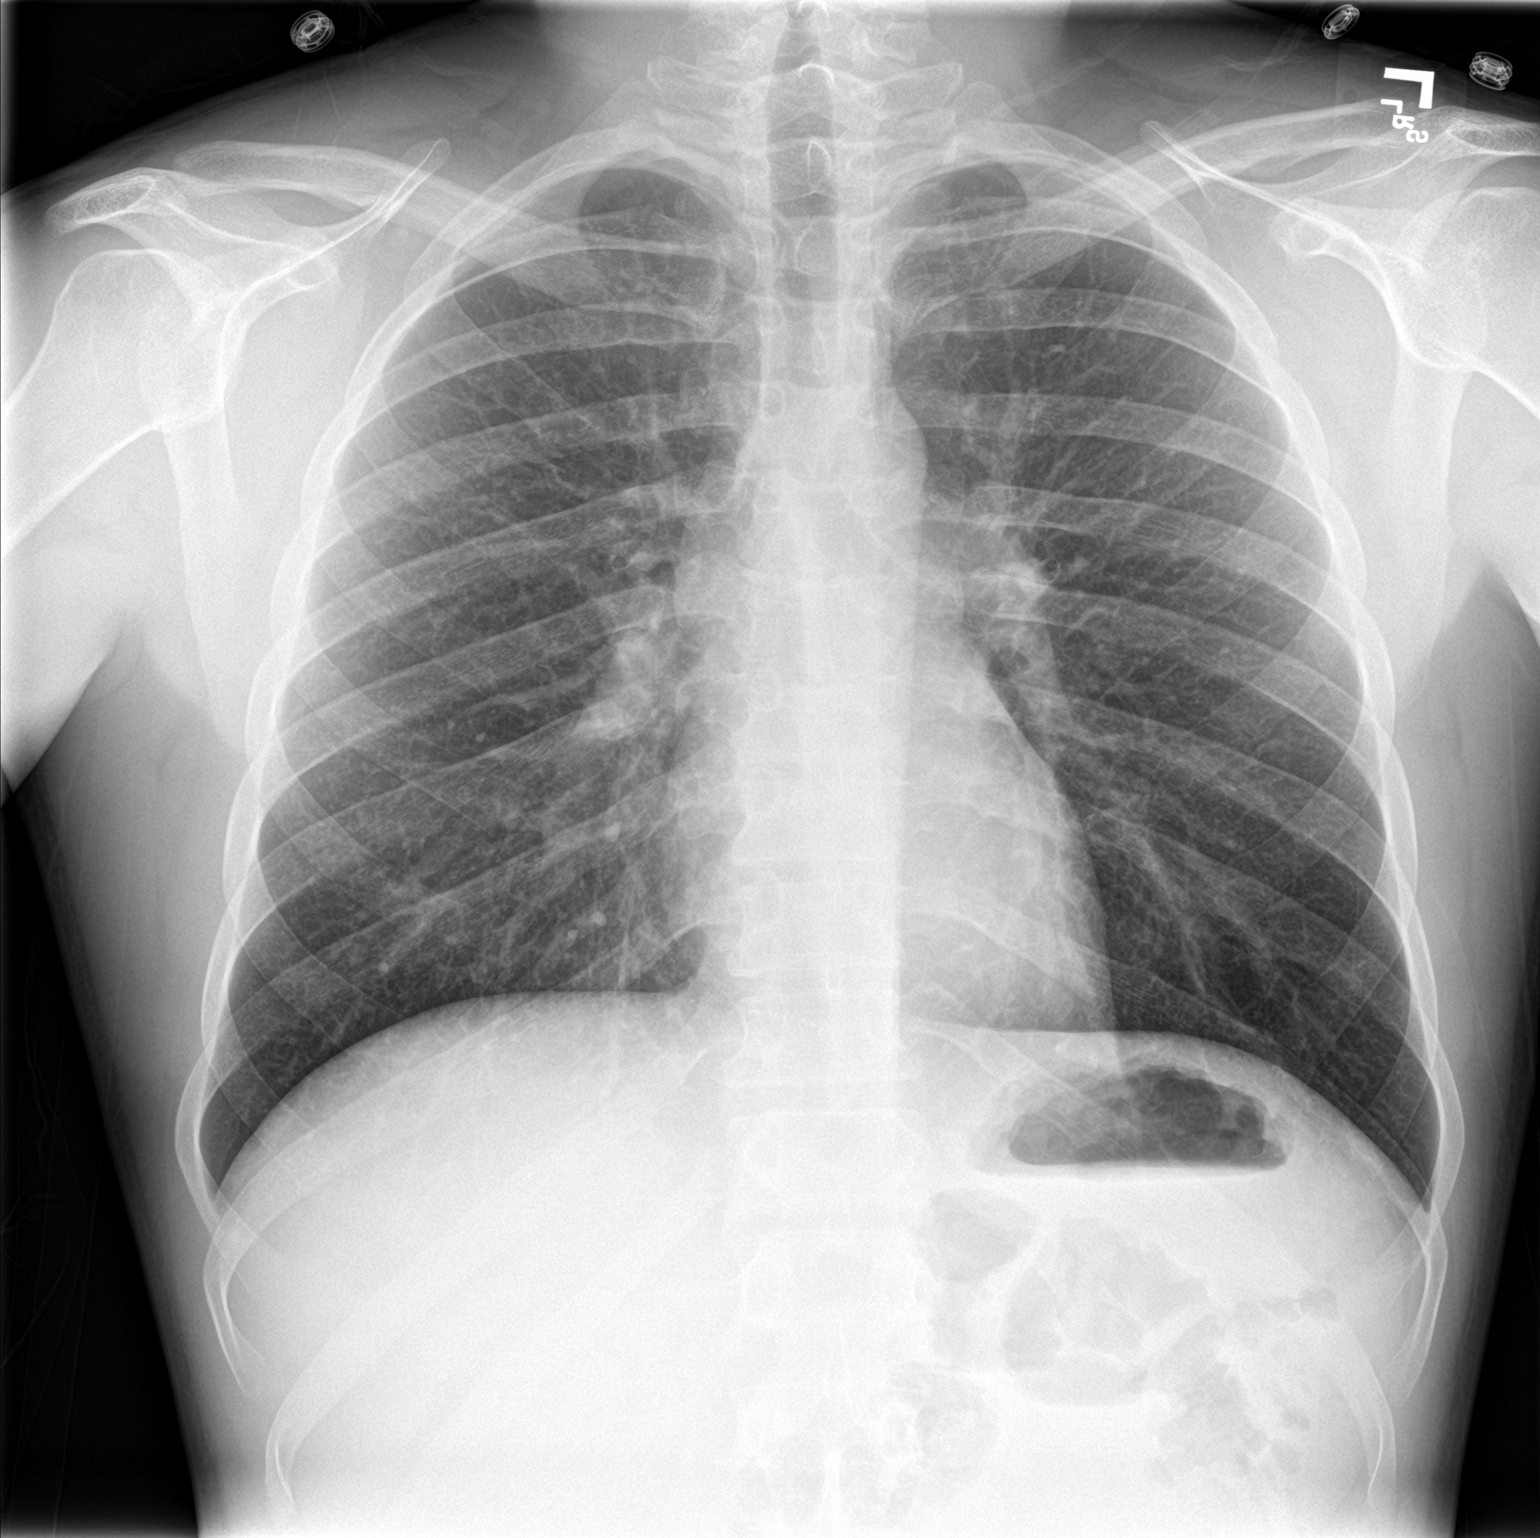

[chest lat]
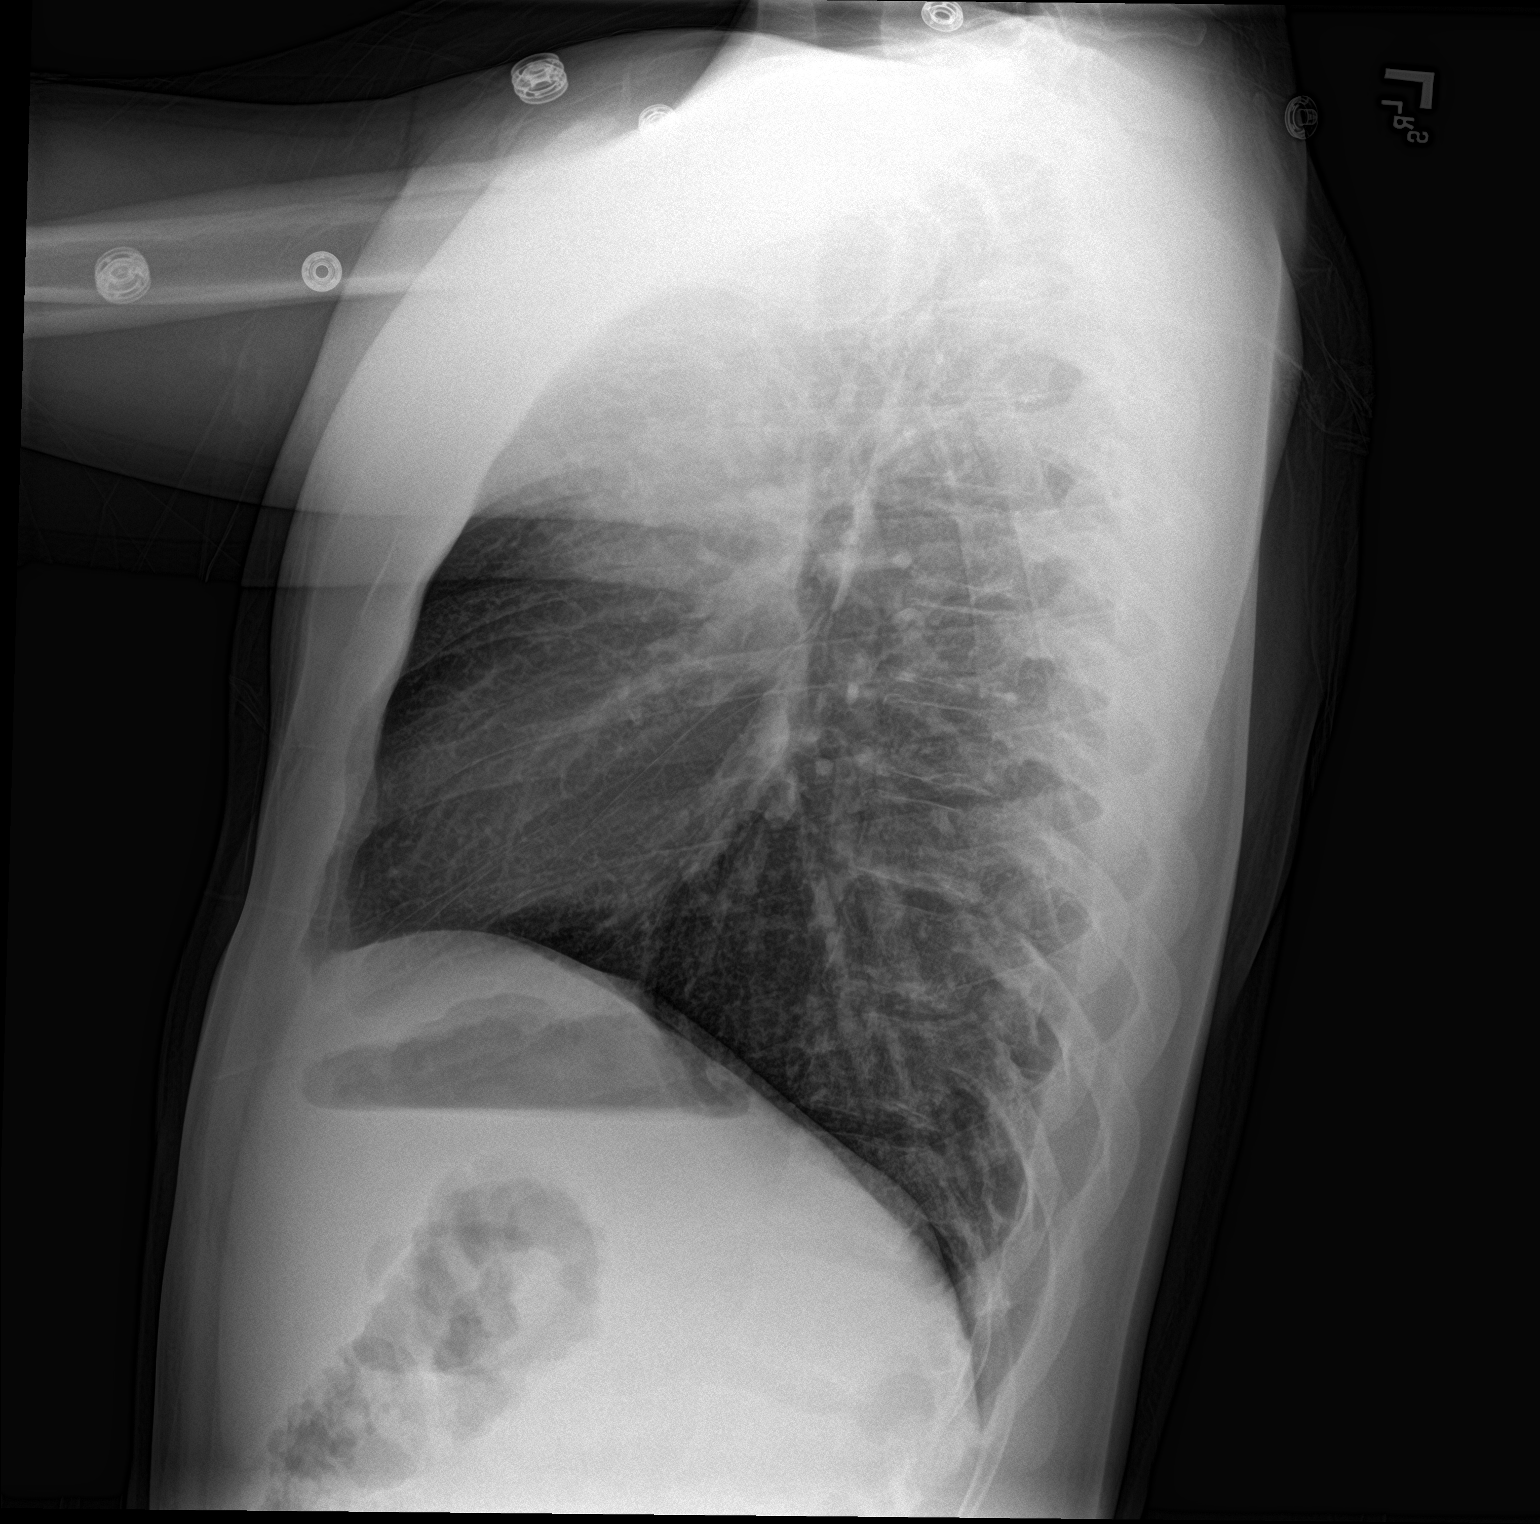

[2 of 2 positions shown; findings below may reference images not displayed]

FINDINGS: Cardiomediastinal silhouette is normal. No pleural effusions or
focal consolidations. Trachea projects midline and there is no
pneumothorax. Soft tissue planes and included osseous structures are
non-suspicious.
IMPRESSION: Normal chest.
